# Patient Record
Sex: Female | Born: 1990 | Hispanic: No | Marital: Married | Smoking: Never smoker
Health system: Southern US, Community
[De-identification: ages and names within clinical notes are randomized; demographics above are authoritative.]

## PROBLEM LIST (undated history)

## (undated) DIAGNOSIS — Z789 Other specified health status: Secondary | ICD-10-CM

## (undated) DIAGNOSIS — L723 Sebaceous cyst: Secondary | ICD-10-CM

## (undated) HISTORY — PX: NO PAST SURGERIES: SHX2092

---

## 2017-04-03 ENCOUNTER — Encounter: Payer: Self-pay | Admitting: Obstetrics & Gynecology

## 2017-04-03 ENCOUNTER — Encounter: Payer: Self-pay | Admitting: *Deleted

## 2017-04-03 ENCOUNTER — Ambulatory Visit (INDEPENDENT_AMBULATORY_CARE_PROVIDER_SITE_OTHER): Payer: Self-pay | Admitting: *Deleted

## 2017-04-03 DIAGNOSIS — Z3200 Encounter for pregnancy test, result unknown: Secondary | ICD-10-CM

## 2017-04-03 DIAGNOSIS — Z3201 Encounter for pregnancy test, result positive: Secondary | ICD-10-CM

## 2017-04-03 LAB — POCT PREGNANCY, URINE: Preg Test, Ur: POSITIVE — AB

## 2017-04-03 NOTE — Progress Notes (Signed)
Here for pregnancy test which was positive. States LMP 02/24/17 and has regular periods. Which makes her 5 wk 3d. Letter given. She is not sure where she will receive prenatal care Not taking any meds. Encouraged to start prenatal vitamins but no other meds without checking with her prenatal provider.

## 2017-05-04 ENCOUNTER — Other Ambulatory Visit (HOSPITAL_COMMUNITY): Payer: Self-pay | Admitting: Nurse Practitioner

## 2017-05-04 DIAGNOSIS — Z3A13 13 weeks gestation of pregnancy: Secondary | ICD-10-CM

## 2017-05-04 DIAGNOSIS — Z3682 Encounter for antenatal screening for nuchal translucency: Secondary | ICD-10-CM

## 2017-05-04 LAB — OB RESULTS CONSOLE RPR: RPR: NONREACTIVE

## 2017-05-04 LAB — OB RESULTS CONSOLE RUBELLA ANTIBODY, IGM: Rubella: IMMUNE

## 2017-05-04 LAB — OB RESULTS CONSOLE HEPATITIS B SURFACE ANTIGEN: HEP B S AG: NEGATIVE

## 2017-05-04 LAB — OB RESULTS CONSOLE GC/CHLAMYDIA
CHLAMYDIA, DNA PROBE: NEGATIVE
Gonorrhea: NEGATIVE

## 2017-05-04 LAB — OB RESULTS CONSOLE ANTIBODY SCREEN: Antibody Screen: NEGATIVE

## 2017-05-04 LAB — OB RESULTS CONSOLE ABO/RH: RH Type: POSITIVE

## 2017-05-04 LAB — OB RESULTS CONSOLE HIV ANTIBODY (ROUTINE TESTING): HIV: NONREACTIVE

## 2017-05-26 ENCOUNTER — Ambulatory Visit (HOSPITAL_COMMUNITY): Payer: Self-pay

## 2017-05-27 ENCOUNTER — Encounter (HOSPITAL_COMMUNITY): Payer: Self-pay | Admitting: *Deleted

## 2017-05-29 ENCOUNTER — Encounter (HOSPITAL_COMMUNITY): Payer: Self-pay

## 2017-05-29 ENCOUNTER — Ambulatory Visit (HOSPITAL_COMMUNITY): Admission: RE | Admit: 2017-05-29 | Payer: Medicaid Other | Source: Ambulatory Visit

## 2017-05-29 ENCOUNTER — Ambulatory Visit (HOSPITAL_COMMUNITY)
Admission: RE | Admit: 2017-05-29 | Discharge: 2017-05-29 | Disposition: A | Discharge status: Home / Self Care | Payer: Medicaid Other | Source: Ambulatory Visit | Attending: Nurse Practitioner | Admitting: Nurse Practitioner

## 2017-05-29 DIAGNOSIS — Z3A13 13 weeks gestation of pregnancy: Secondary | ICD-10-CM | POA: Diagnosis not present

## 2017-05-29 DIAGNOSIS — Z3682 Encounter for antenatal screening for nuchal translucency: Secondary | ICD-10-CM | POA: Diagnosis not present

## 2017-05-29 HISTORY — DX: Other specified health status: Z78.9

## 2017-10-29 LAB — OB RESULTS CONSOLE GC/CHLAMYDIA
Chlamydia: NEGATIVE
GC PROBE AMP, GENITAL: NEGATIVE

## 2017-10-29 LAB — OB RESULTS CONSOLE GBS: STREP GROUP B AG: NEGATIVE

## 2017-12-04 ENCOUNTER — Telehealth (HOSPITAL_COMMUNITY): Payer: Self-pay | Admitting: *Deleted

## 2017-12-04 NOTE — Telephone Encounter (Signed)
Preadmission screen  [phone] interpreter number

## 2017-12-08 ENCOUNTER — Encounter (HOSPITAL_COMMUNITY): Payer: Self-pay

## 2017-12-08 ENCOUNTER — Inpatient Hospital Stay (HOSPITAL_COMMUNITY)
Admission: RE | Admit: 2017-12-08 | Discharge: 2017-12-13 | DRG: 788 | Disposition: A | Discharge status: Home / Self Care | Payer: Medicaid Other | Source: Ambulatory Visit | Attending: Obstetrics and Gynecology | Admitting: Obstetrics and Gynecology

## 2017-12-08 ENCOUNTER — Other Ambulatory Visit: Payer: Self-pay

## 2017-12-08 DIAGNOSIS — Z3A41 41 weeks gestation of pregnancy: Secondary | ICD-10-CM

## 2017-12-08 DIAGNOSIS — O480 Post-term pregnancy: Principal | ICD-10-CM | POA: Diagnosis present

## 2017-12-08 LAB — CBC
HCT: 38.6 % (ref 36.0–46.0)
Hemoglobin: 13.3 g/dL (ref 12.0–15.0)
MCH: 32.3 pg (ref 26.0–34.0)
MCHC: 34.5 g/dL (ref 30.0–36.0)
MCV: 93.7 fL (ref 78.0–100.0)
PLATELETS: 222 10*3/uL (ref 150–400)
RBC: 4.12 MIL/uL (ref 3.87–5.11)
RDW: 12.9 % (ref 11.5–15.5)
WBC: 5 10*3/uL (ref 4.0–10.5)

## 2017-12-08 LAB — ABO/RH: ABO/RH(D): O POS

## 2017-12-08 LAB — TYPE AND SCREEN
ABO/RH(D): O POS
Antibody Screen: NEGATIVE

## 2017-12-08 MED ORDER — LIDOCAINE HCL (PF) 1 % IJ SOLN: 30.0000 mL | INTRAMUSCULAR | Status: DC | PRN

## 2017-12-08 MED ORDER — MISOPROSTOL 50MCG HALF TABLET
50.0000 ug | ORAL_TABLET | ORAL | Status: DC | PRN
  Administered 2017-12-08: 50 ug via ORAL
  Filled 2017-12-08: qty 1

## 2017-12-08 MED ORDER — ONDANSETRON HCL 4 MG/2ML IJ SOLN: 4.0000 mg | Freq: Four times a day (QID) | INTRAMUSCULAR | Status: DC | PRN

## 2017-12-08 MED ORDER — OXYCODONE-ACETAMINOPHEN 5-325 MG PO TABS: 2.0000 | ORAL_TABLET | ORAL | Status: DC | PRN

## 2017-12-08 MED ORDER — OXYTOCIN 40 UNITS IN LACTATED RINGERS INFUSION - SIMPLE MED: 2.5000 [IU]/h | INTRAVENOUS | Status: DC

## 2017-12-08 MED ORDER — OXYTOCIN BOLUS FROM INFUSION: 500.0000 mL | Freq: Once | INTRAVENOUS | Status: DC

## 2017-12-08 MED ORDER — SOD CITRATE-CITRIC ACID 500-334 MG/5ML PO SOLN
30.0000 mL | ORAL | Status: DC | PRN
  Administered 2017-12-10: 30 mL via ORAL
  Filled 2017-12-08: qty 15

## 2017-12-08 MED ORDER — LACTATED RINGERS IV SOLN
500.0000 mL | INTRAVENOUS | Status: DC | PRN
  Administered 2017-12-08 – 2017-12-09 (×3): 500 mL via INTRAVENOUS

## 2017-12-08 MED ORDER — ACETAMINOPHEN 325 MG PO TABS: 650.0000 mg | ORAL_TABLET | ORAL | Status: DC | PRN

## 2017-12-08 MED ORDER — FENTANYL CITRATE (PF) 100 MCG/2ML IJ SOLN
100.0000 ug | INTRAMUSCULAR | Status: DC | PRN
Start: 2017-12-08 — End: 2017-12-10
  Administered 2017-12-09: 100 ug via INTRAVENOUS
  Filled 2017-12-08: qty 2

## 2017-12-08 MED ORDER — OXYTOCIN 40 UNITS IN LACTATED RINGERS INFUSION - SIMPLE MED
1.0000 m[IU]/min | INTRAVENOUS | Status: DC
  Administered 2017-12-08: 1 m[IU]/min via INTRAVENOUS

## 2017-12-08 MED ORDER — TERBUTALINE SULFATE 1 MG/ML IJ SOLN: 0.2500 mg | Freq: Once | INTRAMUSCULAR | Status: DC | PRN

## 2017-12-08 MED ORDER — MISOPROSTOL 25 MCG QUARTER TABLET: 25.0000 ug | ORAL_TABLET | ORAL | Status: DC | PRN

## 2017-12-08 MED ORDER — LACTATED RINGERS IV SOLN
INTRAVENOUS | Status: DC
  Administered 2017-12-08 – 2017-12-10 (×10): via INTRAVENOUS

## 2017-12-08 MED ORDER — OXYTOCIN 40 UNITS IN LACTATED RINGERS INFUSION - SIMPLE MED
1.0000 m[IU]/min | INTRAVENOUS | Status: DC
  Filled 2017-12-08: qty 1000

## 2017-12-08 MED ORDER — OXYCODONE-ACETAMINOPHEN 5-325 MG PO TABS: 1.0000 | ORAL_TABLET | ORAL | Status: DC | PRN

## 2017-12-08 NOTE — Progress Notes (Signed)
LABOR PROGRESS NOTE  Subjective:  Patient seen and examined for progress of labor. Patient comfortable and feels contractions.   Objective:  Vitals:   12/08/17 1931 12/08/17 2116 12/08/17 2219 12/08/17 2234  BP: (!) 108/59 100/61 106/85 94/72  Pulse: 80 83 79 96  Resp: 16 16 16 16   Temp: 98.1 F (36.7 C)     TempSrc: Oral     Weight:      Height:       Dilation: 4.5 Effacement (%): 50 Cervical Position: Posterior Station: -3 Presentation: Vertex Exam by:: Degele FHT: 150 bpm, moderate variability, accelerations present, early decelerations TOCO: regular, every 1-2 minutes  Assessment/Plan: 27 y.o. G2P0010 at 4092w0d here for IOL for postdates  Labor: titrating pit Preeclampsia: N/A Fetal wellbeing: category 1 Pain control: IV pain meds per pt request I/D: N/A Anticipated MOD: continue expectant management, anticipate NSVD  Durward Parcelavid McMullen, DO, PGY-2 12/08/2017, 10:59 PM

## 2017-12-08 NOTE — Progress Notes (Signed)
LABOR PROGRESS NOTE  Lindsey Hines is a 27 y.o. G2P0010 at 478w0d  admitted for IOL for postdates  Subjective: FB out  Objective: BP (!) 106/55 (BP Location: Left Arm)   Pulse 77   Temp 98.4 F (36.9 C) (Oral)   Resp 20   Ht 5\' 5"  (1.651 m)   Wt 195 lb 8 oz (88.7 kg)   LMP 02/24/2017 (Exact Date)   BMI 32.53 kg/m  or  Vitals:   12/08/17 0723 12/08/17 0926 12/08/17 1354 12/08/17 1611  BP: 122/80 104/78 114/72 (!) 106/55  Pulse: 89 92 89 77  Resp:  18 18 20   Temp:   99.2 F (37.3 C) 98.4 F (36.9 C)  TempSrc:   Oral Oral  Weight:      Height:        SVE Dilation: 4 Effacement (%): 60 Station: -2 Presentation: Vertex Exam by:: Marcelino DusterMichelle, RN  FHT: baseline rate 145, moderate varibility, + acel, has had no decels for a while, but just had one 3 minute decel; now FHT reactive again  Toco: ctx q 1-3 min  Assessment / Plan: 27 y.o. G2P0010 at 378w0d here for IOL for postdates  Labor: FB out. Expectant management for now; ctx regularly; start Pit if no progressing Fetal Wellbeing:  Cat II Pain Control:  Per patient's request Anticipated MOD:  SVD  Frederik PearJulie P Tecia Cinnamon, MD 12/08/2017, 7:21 PM

## 2017-12-08 NOTE — H&P (Signed)
Lindsey Hines is a 27 y.o. female presenting for Induction of labor for postdates Received care at [facility]. OB History    Gravida  2   Para      Term      Preterm      AB  1   Living        SAB  1   TAB      Ectopic      Multiple      Live Births             Past Medical History:  Diagnosis Date  . Medical history non-contributory    Past Surgical History:  Procedure Laterality Date  . NO PAST SURGERIES     Family History: family history is not on file. Social History:  reports that she has never smoked. She has never used smokeless tobacco. She reports that she does not drink alcohol or use drugs.     Maternal Diabetes: No Genetic Screening: Normal Maternal Ultrasounds/Referrals: Normal Fetal Ultrasounds or other Referrals:  None Maternal Substance Abuse:  No Significant Maternal Medications:  None Significant Maternal Lab Results:  None Other Comments:  None  Review of Systems  Constitutional: Negative for chills, fever and malaise/fatigue.  Respiratory: Negative for shortness of breath.   Gastrointestinal: Negative for abdominal pain, constipation, diarrhea, nausea and vomiting.   Maternal Medical History:  Reason for admission: Nausea. IOL for postdates   Contractions: Frequency: irregular.   Perceived severity is mild.    Fetal activity: Perceived fetal activity is normal.   Last perceived fetal movement was within the past hour.    Prenatal complications: No PIH, placental abnormality, pre-eclampsia or preterm labor.   Prenatal Complications - Diabetes: none.    Dilation: Fingertip Effacement (%): 60 Station: -1, -2 Exam by:: Hilda LiasMarie, CNM  Blood pressure 122/80, pulse 89, temperature 98.6 F (37 C), temperature source Oral, resp. rate 18, height 5\' 5"  (1.651 m), weight 195 lb 8 oz (88.7 kg), last menstrual period 02/24/2017. Maternal Exam:  Uterine Assessment: Contraction strength is mild.  Contraction frequency is irregular.    Abdomen: Patient reports no abdominal tenderness. Estimated fetal weight is 6.5.   Fetal presentation: vertex  Introitus: Normal vulva. Normal vagina.  Ferning test: not done.  Nitrazine test: not done. Amniotic fluid character: not assessed.  Cervix: Cervix evaluated by digital exam.     Fetal Exam Fetal Monitor Review: Mode: ultrasound.   Baseline rate: 140.  Variability: moderate (6-25 bpm).   Pattern: accelerations present and no decelerations.    Fetal State Assessment: Category I - tracings are normal.     Physical Exam  Constitutional: She is oriented to person, place, and time. She appears well-developed and well-nourished.  HENT:  Head: Normocephalic.  Cardiovascular: Normal rate and regular rhythm.  Respiratory: Effort normal. No respiratory distress. She has no wheezes. She has no rales.  GI: Soft. She exhibits no distension. There is no tenderness. There is no rebound and no guarding.  Genitourinary:  Genitourinary Comments: Dilation: Fingertip Effacement (%): 60 Station: -1, -2 Presentation: Vertex Exam by:: Hilda LiasMarie, CNM    Musculoskeletal: Normal range of motion.  Neurological: She is alert and oriented to person, place, and time.  Skin: Skin is warm and dry.  Psychiatric: She has a normal mood and affect.    Prenatal labs: ABO, Rh: O/Positive/-- (08/27 0000) Antibody: Negative (08/27 0000) Rubella: Immune (08/27 0000) RPR: Nonreactive (08/27 0000)  HBsAg: Negative (08/27 0000)  HIV: Non-reactive (08/27  0000)  GBS: Negative (02/21 0000)   Assessment/Plan: Single IUP at [redacted]w[redacted]d Postterm  Admit to [facility] suites Routine orders Cytotec PO then Foley then Lyndle Herrlich 12/08/2017, 7:57 AM

## 2017-12-08 NOTE — Anesthesia Pain Management Evaluation Note (Signed)
  CRNA Pain Management Visit Note  Patient: Lindsey Hines, 27 y.o., female  "Hello I am a member of the anesthesia team at [facility]. We have an anesthesia team available at all times to provide care throughout the hospital, including epidural management and anesthesia for C-section. I don't know your plan for the delivery whether it a natural birth, water birth, IV sedation, nitrous supplementation, doula or epidural, but we want to meet your pain goals."   1.Was your pain managed to your expectations on prior hospitalizations?   No prior hospitalizations  2.What is your expectation for pain management during this hospitalization?     Labor support without medications, Epidural, IV pain meds and Nitrous Oxide  3.How can we help you reach that goal? All methods of pain management discussed and questions answered.  Record the patient's initial score and the patient's pain goal.   Pain: 0  Pain Goal: 7 [facility] wants you to be able to say your pain was always managed very well.  Lindsey Hines 12/08/2017

## 2017-12-08 NOTE — Progress Notes (Signed)
LABOR PROGRESS NOTE  Lindsey Hines is a 27 y.o. G2P0010 at 751w0d  admitted for IOL for postdates  Subjective: Pt doing well; feeling some contractions  Objective: BP 114/72   Pulse 89   Temp 99.2 F (37.3 C) (Oral)   Resp 18   Ht 5\' 5"  (1.651 m)   Wt 195 lb 8 oz (88.7 kg)   LMP 02/24/2017 (Exact Date)   BMI 32.53 kg/m  or  Vitals:   12/08/17 0722 12/08/17 0723 12/08/17 0926 12/08/17 1354  BP:  122/80 104/78 114/72  Pulse:  89 92 89  Resp: 18  18 18   Temp: 98.6 F (37 C)   99.2 F (37.3 C)  TempSrc: Oral   Oral  Weight:      Height:        SVE Dilation: 1 Effacement (%): 60 Station: -3 Presentation: Vertex Exam by:: dr Murlean Seelye Amada KingfisherFHT: baseline rate 145, moderate varibility, + acel, occasinal prolonged decels (~4 min, with good spontaneous recovery) Toco: ctx q 1-4 min  Assessment / Plan: 27 y.o. G2P0010 at 581w0d here for IOL for postdates  Labor: s/p cytotec x 1. FB placed at 12:45. Will hold off on additional cytotec d/t decels. Pt is contracting regularly. Likely start Pitocin next Fetal Wellbeing:  Cat II Pain Control:  Per patient's request Anticipated MOD:  SVD  Frederik PearJulie P Cono Gebhard, MD 12/08/2017, 2:21 PM

## 2017-12-09 ENCOUNTER — Inpatient Hospital Stay (HOSPITAL_COMMUNITY): Payer: Medicaid Other | Admitting: Anesthesiology

## 2017-12-09 LAB — RPR: RPR: NONREACTIVE

## 2017-12-09 MED ORDER — DIPHENHYDRAMINE HCL 50 MG/ML IJ SOLN: 12.5000 mg | INTRAMUSCULAR | Status: DC | PRN

## 2017-12-09 MED ORDER — OXYTOCIN 40 UNITS IN LACTATED RINGERS INFUSION - SIMPLE MED
1.0000 m[IU]/min | INTRAVENOUS | Status: DC
  Administered 2017-12-10: 2 m[IU]/min via INTRAVENOUS

## 2017-12-09 MED ORDER — LACTATED RINGERS IV SOLN: 500.0000 mL | Freq: Once | INTRAVENOUS | Status: DC

## 2017-12-09 MED ORDER — EPHEDRINE 5 MG/ML INJ: 10.0000 mg | INTRAVENOUS | Status: DC | PRN

## 2017-12-09 MED ORDER — PHENYLEPHRINE 40 MCG/ML (10ML) SYRINGE FOR IV PUSH (FOR BLOOD PRESSURE SUPPORT)
80.0000 ug | PREFILLED_SYRINGE | INTRAVENOUS | Status: DC | PRN
  Filled 2017-12-09: qty 10

## 2017-12-09 MED ORDER — LIDOCAINE HCL (PF) 1 % IJ SOLN
INTRAMUSCULAR | Status: DC | PRN
  Administered 2017-12-09 (×2): 5 mL

## 2017-12-09 MED ORDER — PHENYLEPHRINE 40 MCG/ML (10ML) SYRINGE FOR IV PUSH (FOR BLOOD PRESSURE SUPPORT)
80.0000 ug | PREFILLED_SYRINGE | INTRAVENOUS | Status: DC | PRN
  Administered 2017-12-09 (×2): 80 ug via INTRAVENOUS
  Filled 2017-12-09: qty 10

## 2017-12-09 MED ORDER — FENTANYL 2.5 MCG/ML BUPIVACAINE 1/10 % EPIDURAL INFUSION (WH - ANES)
14.0000 mL/h | INTRAMUSCULAR | Status: DC | PRN
  Administered 2017-12-09: 14 mL/h via EPIDURAL
  Filled 2017-12-09: qty 100

## 2017-12-09 NOTE — Progress Notes (Signed)
Patient ID: Lindsey Hines, female   DOB: 04/22/1991, 27 y.o.   MRN: [id number] Doing well, but hurting with contractions Wants epidural  Vitals:   12/09/17 1721 12/09/17 1735 12/09/17 1808 12/09/17 2038  BP:  116/76 115/70 (!) 99/46  Pulse:  80 95 84  Resp: 16 18 18 16   Temp:    97.9 F (36.6 C)  TempSrc:    Oral  Weight:      Height:       FHR stable UCs every 3 min  Dilation: 4 Effacement (%): 70 Cervical Position: Posterior Station: -2, Ballotable Presentation: Vertex Exam by:: Wynelle BourgeoisMarie Williams, CNM  Discussed cervix has not changed and I believe it is 4cm and not 5cm Recommend one hour Pitocin break and then possible AROM and IUPC  WIll reassess after epidural

## 2017-12-09 NOTE — Progress Notes (Signed)
Patient ID: Lauretta GrillDalal Yousif Hatton, female   DOB: 11/11/1990, 27 y.o.   MRN: [id number] Called to see pt forfetal bradycardic episode following post-epidural hypotensive episode BPs noted to be hypotensive Ephedrine given x 2 Fluid bolusing  FHR recovered to baseline after 8 minutes  Recommend 500ml bolus Monitor VS Continue to observe

## 2017-12-09 NOTE — Progress Notes (Signed)
Labor Progress Note Lindsey Hines is a 27 y.o. G2P0010 at 6032w1d presented for IOL for postdates  S:  Patient comfortable  O:  BP 109/79   Pulse 62   Temp 98.3 F (36.8 C) (Oral)   Resp 16   Ht 5\' 5"  (1.651 m)   Wt 195 lb 8 oz (88.7 kg)   LMP 02/24/2017 (Exact Date)   BMI 32.53 kg/m   Fetal Tracing:  Baseline: 135 Variability: moderate Accels: 15x15 Decels: none  Toco: 2-4  CVE: Dilation: 5 Effacement (%): 60 Cervical Position: Posterior Station: -3 Presentation: Vertex Exam by:: Rayfield Citizenaroline, CNM   A&P: 27 y.o. G2P0010 5632w1d postdates IOL #Labor: Cervix more anterior but unchanged in dilation. Will continue pitocin 2x2. #Pain: per patient request #FWB: Cat 1 #GBS negative  Rolm Bookbinderaroline M Charda Janis, CNM 1:27 PM

## 2017-12-09 NOTE — Anesthesia Procedure Notes (Signed)
Epidural Patient location during procedure: OB  Staffing Anesthesiologist: Melody Savidge, MD Performed: anesthesiologist   Preanesthetic Checklist Completed: patient identified, site marked, surgical consent, pre-op evaluation, timeout performed, IV checked, risks and benefits discussed and monitors and equipment checked  Epidural Patient position: sitting Prep: DuraPrep Patient monitoring: heart rate, continuous pulse ox and blood pressure Approach: right paramedian Location: L3-L4 Injection technique: LOR saline  Needle:  Needle type: Tuohy  Needle gauge: 17 G Needle length: 9 cm and 9 Needle insertion depth: 7 cm Catheter type: closed end flexible Catheter size: 20 Guage Catheter at skin depth: 11 cm Test dose: negative  Assessment Events: blood not aspirated, injection not painful, no injection resistance, negative IV test and no paresthesia  Additional Notes Patient identified. Risks/Benefits/Options discussed with patient including but not limited to bleeding, infection, nerve damage, paralysis, failed block, incomplete pain control, headache, blood pressure changes, nausea, vomiting, reactions to medication both or allergic, itching and postpartum back pain. Confirmed with bedside nurse the patient's most recent platelet count. Confirmed with patient that they are not currently taking any anticoagulation, have any bleeding history or any family history of bleeding disorders. Patient expressed understanding and wished to proceed. All questions were answered. Sterile technique was used throughout the entire procedure. Please see nursing notes for vital signs. Test dose was given through epidural needle and negative prior to continuing to dose epidural or start infusion. Warning signs of high block given to the patient including shortness of breath, tingling/numbness in hands, complete motor block, or any concerning symptoms with instructions to call for help. Patient was given  instructions on fall risk and not to get out of bed. All questions and concerns addressed with instructions to call with any issues.     

## 2017-12-09 NOTE — Progress Notes (Signed)
LABOR PROGRESS NOTE  Subjective:  Patient seen and examined for progress of labor. Patient comfortable without need for pain meds and endorses pressure sensation which has been more frequent and intense.   Objective:  Vitals:   12/09/17 0031 12/09/17 0101 12/09/17 0131 12/09/17 0201  BP: 110/83 (!) 95/52 (!) 92/57 93/60  Pulse: 88 80 66 69  Resp: 16 16 16 16   Temp:      TempSrc:      Weight:      Height:       Dilation: 5 Effacement (%): 50 Cervical Position: Posterior Station: -2 Presentation: Vertex Exam by:: Marius Ditchaniell Wade, RN FHT: 150 bpm, moderate variability, accelerations present, absent decelerations TOCO: irregular, every 3-5 minutes  Assessment/Plan: 27 y.o.G2P0010 at 1039w0d here for IOL for postdates. Will continue titrating pit. Patient hopes to avoid epidural and seems to be tolerating pain without need for meds.  Labor: titrating pit Preeclampsia: N/A Fetal wellbeing: category 1 Pain control: IV pain meds per pt request I/D: neg Anticipated MOD: continue expectant management, anticipate NSVD  Durward Parcelavid McMullen, DO, PGY-2 12/09/2017, 2:27 AM

## 2017-12-09 NOTE — Progress Notes (Signed)
Labor Progress Note Mattilyn Druscilla BrownieYousif Depaz is a 27 y.o. G2P0010 at 1077w1d presented for IOL for postdates.  S:  Patient comfortable  O:  BP 94/67 (BP Location: Left Arm)   Pulse 93   Temp 97.9 F (36.6 C) (Oral)   Resp 16   Ht 5\' 5"  (1.651 m)   Wt 195 lb 8 oz (88.7 kg)   LMP 02/24/2017 (Exact Date)   BMI 32.53 kg/m   Fetal Tracing:  Baseline: 130 Variability: moderate Accels: 15x15 Decels: none  Toco: 2-4  CVE: Dilation: 5 Effacement (%): 60 Cervical Position: Posterior Station: -3 Presentation: Vertex Exam by:: Rayfield Citizenaroline, CNM   A&P: 27 y.o. G2P0010 607w1d IOL for postdates #Labor: No cervical change and fetal station still high in pelvis. Will change pitocin to 2x2 and recheck in 2 hours.  #Pain: plans IV pain medication #FWB: Cat 1 #GBS negative  Rolm Bookbinderaroline M Neill, CNM 9:10 AM

## 2017-12-09 NOTE — Progress Notes (Signed)
Labor Progress Note Lindsey Hines is a 27 y.o. G2P0010 at 5147w1d presented for postdates IOL  S:  Patient beginning to feel uncomfortable with contractions  O:  BP 115/70   Pulse 95   Temp (!) 97.5 F (36.4 C) (Axillary)   Resp 18   Ht 5\' 5"  (1.651 m)   Wt 195 lb 8 oz (88.7 kg)   LMP 02/24/2017 (Exact Date)   BMI 32.53 kg/m   Fetal Tracing:  Baseline: 145 Variability: moderate Accels: 15x15 Decels: none  Toco: 2-4   CVE: Dilation: 5.5 Effacement (%): 70 Cervical Position: Posterior Station: -3 Presentation: Vertex Exam by:: Rayfield Citizenaroline, CNM   A&P: 27 y.o. G2P0010 5547w1d postdates IOL #Labor: Some cervical change made, now with bloody show and patient discomfort. Will continue pitocin.  #Pain: per patient request #FWB: Cat 1 #GBS negative  Rolm Bookbinderaroline M Neill, CNM 05:00PM

## 2017-12-09 NOTE — Anesthesia Preprocedure Evaluation (Signed)

## 2017-12-10 ENCOUNTER — Encounter (HOSPITAL_COMMUNITY)
Admission: RE | Disposition: A | Discharge status: Home / Self Care | Payer: Self-pay | Source: Ambulatory Visit | Attending: Obstetrics and Gynecology

## 2017-12-10 ENCOUNTER — Encounter (HOSPITAL_COMMUNITY): Payer: Self-pay | Admitting: Obstetrics and Gynecology

## 2017-12-10 DIAGNOSIS — Z3A41 41 weeks gestation of pregnancy: Secondary | ICD-10-CM

## 2017-12-10 DIAGNOSIS — O480 Post-term pregnancy: Secondary | ICD-10-CM

## 2017-12-10 LAB — BIRTH TISSUE RECOVERY COLLECTION (PLACENTA DONATION)

## 2017-12-10 LAB — CREATININE, SERUM: CREATININE: 0.52 mg/dL (ref 0.44–1.00)

## 2017-12-10 SURGERY — Surgical Case
Anesthesia: Epidural

## 2017-12-10 MED ORDER — SODIUM CHLORIDE 0.9 % IR SOLN
Status: DC | PRN
  Administered 2017-12-10: 1

## 2017-12-10 MED ORDER — ONDANSETRON HCL 4 MG/2ML IJ SOLN
INTRAMUSCULAR | Status: DC | PRN
  Administered 2017-12-10: 4 mg via INTRAVENOUS

## 2017-12-10 MED ORDER — LIDOCAINE-EPINEPHRINE (PF) 2 %-1:200000 IJ SOLN
INTRAMUSCULAR | Status: AC
  Filled 2017-12-10: qty 20

## 2017-12-10 MED ORDER — WITCH HAZEL-GLYCERIN EX PADS: 1.0000 "application " | MEDICATED_PAD | CUTANEOUS | Status: DC | PRN

## 2017-12-10 MED ORDER — KETOROLAC TROMETHAMINE 30 MG/ML IJ SOLN: 30.0000 mg | Freq: Four times a day (QID) | INTRAMUSCULAR | Status: AC | PRN

## 2017-12-10 MED ORDER — FENTANYL CITRATE (PF) 100 MCG/2ML IJ SOLN: 25.0000 ug | INTRAMUSCULAR | Status: DC | PRN

## 2017-12-10 MED ORDER — NALOXONE HCL 0.4 MG/ML IJ SOLN: 0.4000 mg | INTRAMUSCULAR | Status: DC | PRN

## 2017-12-10 MED ORDER — GENTAMICIN SULFATE 40 MG/ML IJ SOLN
180.0000 mg | Freq: Once | INTRAVENOUS | Status: DC
  Filled 2017-12-10: qty 4.5

## 2017-12-10 MED ORDER — DEXAMETHASONE SODIUM PHOSPHATE 10 MG/ML IJ SOLN
INTRAMUSCULAR | Status: DC | PRN
  Administered 2017-12-10: 10 mg via INTRAVENOUS

## 2017-12-10 MED ORDER — NALBUPHINE HCL 10 MG/ML IJ SOLN: 5.0000 mg | Freq: Once | INTRAMUSCULAR | Status: DC | PRN

## 2017-12-10 MED ORDER — SIMETHICONE 80 MG PO CHEW: 80.0000 mg | CHEWABLE_TABLET | ORAL | Status: DC | PRN

## 2017-12-10 MED ORDER — OXYTOCIN 40 UNITS IN LACTATED RINGERS INFUSION - SIMPLE MED
2.5000 [IU]/h | INTRAVENOUS | Status: AC
  Administered 2017-12-10: 2.5 [IU]/h via INTRAVENOUS

## 2017-12-10 MED ORDER — SENNOSIDES-DOCUSATE SODIUM 8.6-50 MG PO TABS
2.0000 | ORAL_TABLET | ORAL | Status: DC
  Administered 2017-12-11 – 2017-12-12 (×3): 2 via ORAL
  Filled 2017-12-10 (×3): qty 2

## 2017-12-10 MED ORDER — DIBUCAINE 1 % RE OINT: 1.0000 "application " | TOPICAL_OINTMENT | RECTAL | Status: DC | PRN

## 2017-12-10 MED ORDER — IBUPROFEN 600 MG PO TABS
600.0000 mg | ORAL_TABLET | Freq: Four times a day (QID) | ORAL | Status: DC
  Administered 2017-12-10 – 2017-12-13 (×14): 600 mg via ORAL
  Filled 2017-12-10 (×15): qty 1

## 2017-12-10 MED ORDER — ACETAMINOPHEN 325 MG PO TABS
650.0000 mg | ORAL_TABLET | ORAL | Status: DC | PRN
  Administered 2017-12-11 – 2017-12-13 (×5): 650 mg via ORAL
  Filled 2017-12-10 (×5): qty 2

## 2017-12-10 MED ORDER — PRENATAL MULTIVITAMIN CH
1.0000 | ORAL_TABLET | Freq: Every day | ORAL | Status: DC
  Administered 2017-12-10 – 2017-12-13 (×4): 1 via ORAL
  Filled 2017-12-10 (×4): qty 1

## 2017-12-10 MED ORDER — SODIUM CHLORIDE 0.9% FLUSH: 3.0000 mL | INTRAVENOUS | Status: DC | PRN

## 2017-12-10 MED ORDER — COCONUT OIL OIL
1.0000 "application " | TOPICAL_OIL | Status: DC | PRN
  Administered 2017-12-12: 1 via TOPICAL
  Filled 2017-12-10: qty 120

## 2017-12-10 MED ORDER — SODIUM CHLORIDE 0.9 % IV SOLN
2.0000 g | Freq: Four times a day (QID) | INTRAVENOUS | Status: DC
  Administered 2017-12-10: 2 g via INTRAVENOUS
  Filled 2017-12-10: qty 2
  Filled 2017-12-10 (×2): qty 2000

## 2017-12-10 MED ORDER — NALBUPHINE HCL 10 MG/ML IJ SOLN: 5.0000 mg | INTRAMUSCULAR | Status: DC | PRN

## 2017-12-10 MED ORDER — SCOPOLAMINE 1 MG/3DAYS TD PT72: 1.0000 | MEDICATED_PATCH | Freq: Once | TRANSDERMAL | Status: DC

## 2017-12-10 MED ORDER — ZOLPIDEM TARTRATE 5 MG PO TABS: 5.0000 mg | ORAL_TABLET | Freq: Every evening | ORAL | Status: DC | PRN

## 2017-12-10 MED ORDER — LACTATED RINGERS IV SOLN
INTRAVENOUS | Status: DC
  Administered 2017-12-10: 18:00:00 via INTRAVENOUS

## 2017-12-10 MED ORDER — ONDANSETRON HCL 4 MG/2ML IJ SOLN
INTRAMUSCULAR | Status: AC
  Filled 2017-12-10: qty 2

## 2017-12-10 MED ORDER — SCOPOLAMINE 1 MG/3DAYS TD PT72
MEDICATED_PATCH | TRANSDERMAL | Status: DC | PRN
  Administered 2017-12-10: 1 via TRANSDERMAL

## 2017-12-10 MED ORDER — SIMETHICONE 80 MG PO CHEW
80.0000 mg | CHEWABLE_TABLET | Freq: Three times a day (TID) | ORAL | Status: DC
  Administered 2017-12-10 – 2017-12-13 (×12): 80 mg via ORAL
  Filled 2017-12-10 (×12): qty 1

## 2017-12-10 MED ORDER — MEPERIDINE HCL 25 MG/ML IJ SOLN: 6.2500 mg | INTRAMUSCULAR | Status: DC | PRN

## 2017-12-10 MED ORDER — CEFAZOLIN SODIUM-DEXTROSE 2-3 GM-%(50ML) IV SOLR
INTRAVENOUS | Status: DC | PRN
  Administered 2017-12-10: 2 g via INTRAVENOUS

## 2017-12-10 MED ORDER — MORPHINE SULFATE (PF) 0.5 MG/ML IJ SOLN
INTRAMUSCULAR | Status: AC
  Filled 2017-12-10: qty 10

## 2017-12-10 MED ORDER — DIPHENHYDRAMINE HCL 50 MG/ML IJ SOLN
12.5000 mg | INTRAMUSCULAR | Status: DC | PRN
  Administered 2017-12-10: 12.5 mg via INTRAVENOUS
  Filled 2017-12-10: qty 1

## 2017-12-10 MED ORDER — PHENYLEPHRINE 40 MCG/ML (10ML) SYRINGE FOR IV PUSH (FOR BLOOD PRESSURE SUPPORT)
PREFILLED_SYRINGE | INTRAVENOUS | Status: AC
  Filled 2017-12-10: qty 10

## 2017-12-10 MED ORDER — TETANUS-DIPHTH-ACELL PERTUSSIS 5-2.5-18.5 LF-MCG/0.5 IM SUSP: 0.5000 mL | Freq: Once | INTRAMUSCULAR | Status: DC

## 2017-12-10 MED ORDER — SIMETHICONE 80 MG PO CHEW
80.0000 mg | CHEWABLE_TABLET | ORAL | Status: DC
  Administered 2017-12-11 – 2017-12-12 (×3): 80 mg via ORAL
  Filled 2017-12-10 (×3): qty 1

## 2017-12-10 MED ORDER — MORPHINE SULFATE (PF) 0.5 MG/ML IJ SOLN
INTRAMUSCULAR | Status: DC | PRN
  Administered 2017-12-10: 3 mg via EPIDURAL

## 2017-12-10 MED ORDER — SCOPOLAMINE 1 MG/3DAYS TD PT72
MEDICATED_PATCH | TRANSDERMAL | Status: AC
  Filled 2017-12-10: qty 1

## 2017-12-10 MED ORDER — LACTATED RINGERS IV SOLN
INTRAVENOUS | Status: DC | PRN
  Administered 2017-12-10: 40 [IU] via INTRAVENOUS

## 2017-12-10 MED ORDER — DEXAMETHASONE SODIUM PHOSPHATE 10 MG/ML IJ SOLN
INTRAMUSCULAR | Status: AC
  Filled 2017-12-10: qty 1

## 2017-12-10 MED ORDER — ONDANSETRON HCL 4 MG/2ML IJ SOLN: 4.0000 mg | Freq: Three times a day (TID) | INTRAMUSCULAR | Status: DC | PRN

## 2017-12-10 MED ORDER — MENTHOL 3 MG MT LOZG: 1.0000 | LOZENGE | OROMUCOSAL | Status: DC | PRN

## 2017-12-10 MED ORDER — SODIUM BICARBONATE 8.4 % IV SOLN
INTRAVENOUS | Status: DC | PRN
  Administered 2017-12-10: 10 mL via EPIDURAL

## 2017-12-10 MED ORDER — NALOXONE HCL 4 MG/10ML IJ SOLN
1.0000 ug/kg/h | INTRAVENOUS | Status: DC | PRN
  Filled 2017-12-10: qty 5

## 2017-12-10 MED ORDER — GENTAMICIN SULFATE 40 MG/ML IJ SOLN
170.0000 mg | Freq: Three times a day (TID) | INTRAMUSCULAR | Status: DC
  Filled 2017-12-10: qty 4.25

## 2017-12-10 MED ORDER — CEFAZOLIN SODIUM-DEXTROSE 2-4 GM/100ML-% IV SOLN
INTRAVENOUS | Status: AC
  Filled 2017-12-10: qty 100

## 2017-12-10 MED ORDER — SODIUM BICARBONATE 8.4 % IV SOLN
INTRAVENOUS | Status: AC
  Filled 2017-12-10: qty 50

## 2017-12-10 MED ORDER — OXYTOCIN 10 UNIT/ML IJ SOLN
INTRAMUSCULAR | Status: AC
  Filled 2017-12-10: qty 4

## 2017-12-10 MED ORDER — ENOXAPARIN SODIUM 40 MG/0.4ML ~~LOC~~ SOLN
40.0000 mg | SUBCUTANEOUS | Status: DC
  Administered 2017-12-11 – 2017-12-12 (×2): 40 mg via SUBCUTANEOUS
  Filled 2017-12-10 (×3): qty 0.4

## 2017-12-10 MED ORDER — GENTAMICIN SULFATE 40 MG/ML IJ SOLN
170.0000 mg | Freq: Three times a day (TID) | INTRAVENOUS | Status: AC
  Administered 2017-12-10 (×2): 170 mg via INTRAVENOUS
  Filled 2017-12-10 (×2): qty 4.25

## 2017-12-10 MED ORDER — LACTATED RINGERS IV SOLN
INTRAVENOUS | Status: DC | PRN
  Administered 2017-12-10: 04:00:00 via INTRAVENOUS

## 2017-12-10 MED ORDER — DIPHENHYDRAMINE HCL 25 MG PO CAPS
25.0000 mg | ORAL_CAPSULE | ORAL | Status: DC | PRN
Start: 2017-12-10 — End: 2017-12-13
  Administered 2017-12-10 – 2017-12-11 (×2): 25 mg via ORAL
  Filled 2017-12-10 (×3): qty 1

## 2017-12-10 MED ORDER — DIPHENHYDRAMINE HCL 25 MG PO CAPS: 25.0000 mg | ORAL_CAPSULE | Freq: Four times a day (QID) | ORAL | Status: DC | PRN

## 2017-12-10 MED ORDER — METOCLOPRAMIDE HCL 5 MG/ML IJ SOLN: 10.0000 mg | Freq: Once | INTRAMUSCULAR | Status: DC | PRN

## 2017-12-10 MED ORDER — ACETAMINOPHEN 500 MG PO TABS
1000.0000 mg | ORAL_TABLET | Freq: Four times a day (QID) | ORAL | Status: AC
  Administered 2017-12-10 – 2017-12-11 (×3): 1000 mg via ORAL
  Filled 2017-12-10 (×3): qty 2

## 2017-12-10 MED ORDER — LACTATED RINGERS IV SOLN: INTRAVENOUS | Status: DC

## 2017-12-10 MED ORDER — PHENYLEPHRINE HCL 10 MG/ML IJ SOLN
INTRAMUSCULAR | Status: DC | PRN
  Administered 2017-12-10: 80 ug via INTRAVENOUS

## 2017-12-10 SURGICAL SUPPLY — 36 items
BENZOIN TINCTURE PRP APPL 2/3 (GAUZE/BANDAGES/DRESSINGS) ×2 IMPLANT
CHLORAPREP W/TINT 26ML (MISCELLANEOUS) ×2 IMPLANT
CLAMP CORD UMBIL (MISCELLANEOUS) IMPLANT
CLOSURE STERI STRIP 1/2 X4 (GAUZE/BANDAGES/DRESSINGS) ×2 IMPLANT
CLOTH BEACON ORANGE TIMEOUT ST (SAFETY) ×2 IMPLANT
DRSG OPSITE POSTOP 4X10 (GAUZE/BANDAGES/DRESSINGS) ×2 IMPLANT
ELECT REM PT RETURN 9FT ADLT (ELECTROSURGICAL) ×2
ELECTRODE REM PT RTRN 9FT ADLT (ELECTROSURGICAL) ×1 IMPLANT
EXTRACTOR VACUUM KIWI (MISCELLANEOUS) IMPLANT
GLOVE BIO SURGEON ST LM GN SZ9 (GLOVE) ×2 IMPLANT
GLOVE BIOGEL PI IND STRL 7.0 (GLOVE) ×1 IMPLANT
GLOVE BIOGEL PI IND STRL 9 (GLOVE) ×1 IMPLANT
GLOVE BIOGEL PI INDICATOR 7.0 (GLOVE) ×1
GLOVE BIOGEL PI INDICATOR 9 (GLOVE) ×1
GOWN STRL REUS W/TWL 2XL LVL3 (GOWN DISPOSABLE) ×2 IMPLANT
GOWN STRL REUS W/TWL LRG LVL3 (GOWN DISPOSABLE) ×2 IMPLANT
NEEDLE HYPO 25X5/8 SAFETYGLIDE (NEEDLE) IMPLANT
NS IRRIG 1000ML POUR BTL (IV SOLUTION) ×2 IMPLANT
PACK C SECTION WH (CUSTOM PROCEDURE TRAY) ×2 IMPLANT
PAD OB MATERNITY 4.3X12.25 (PERSONAL CARE ITEMS) ×2 IMPLANT
PENCIL SMOKE EVAC W/HOLSTER (ELECTROSURGICAL) ×2 IMPLANT
RTRCTR C-SECT PINK 25CM LRG (MISCELLANEOUS) IMPLANT
RTRCTR C-SECT PINK 34CM XLRG (MISCELLANEOUS) IMPLANT
STRIP CLOSURE SKIN 1/2X4 (GAUZE/BANDAGES/DRESSINGS) ×2 IMPLANT
SUT MNCRL 0 VIOLET CTX 36 (SUTURE) ×2 IMPLANT
SUT MONOCRYL 0 CTX 36 (SUTURE) ×2
SUT PLAIN 2 0 (SUTURE) ×1
SUT PLAIN ABS 2-0 CT1 27XMFL (SUTURE) ×1 IMPLANT
SUT VIC AB 0 CT1 27 (SUTURE) ×1
SUT VIC AB 0 CT1 27XBRD ANBCTR (SUTURE) ×1 IMPLANT
SUT VIC AB 2-0 CT1 27 (SUTURE) ×1
SUT VIC AB 2-0 CT1 TAPERPNT 27 (SUTURE) ×1 IMPLANT
SUT VIC AB 4-0 KS 27 (SUTURE) ×2 IMPLANT
SYR BULB IRRIGATION 50ML (SYRINGE) IMPLANT
TOWEL OR 17X24 6PK STRL BLUE (TOWEL DISPOSABLE) ×2 IMPLANT
TRAY FOLEY BAG SILVER LF 14FR (SET/KITS/TRAYS/PACK) ×2 IMPLANT

## 2017-12-10 NOTE — Progress Notes (Signed)
Patient ID: Lindsey Hines, female   DOB: 05/10/1991, 27 y.o.   MRN: [id number] Called to room for FHR decel  Decel lasted 2-3 min, then recovered to baseline  Good variability but few accels  UCs are not strong Pitocin infusing at low dose  Will start amnioinfusion

## 2017-12-10 NOTE — Op Note (Addendum)
12/08/2017 - 12/10/2017  5:27 AM  PATIENT:  Lindsey Hines  27 y.o. female  PRE-OPERATIVE DIAGNOSIS:  primary cesarean section due to fetal heart rate indication, fetal intolerance of labor  POST-OPERATIVE DIAGNOSIS:  primary cesarean section due to fetal heart rate indication, fetal intolerance of labor, delivered  PROCEDURE:  Procedure(s): CESAREAN SECTION (N/A) primary low transverse   SURGEON:  Surgeon(s) and Role:    * Tilda BurrowFerguson, Kaire Stary V, MD - Primary  PHYSICIAN ASSISTANT: Sharyon CableAaron Wooten, PA-S  ASSISTANTS: none   ANESTHESIA:   epidural  EBL:  600 mL   BLOOD ADMINISTERED:none  DRAINS: Urinary Catheter (Foley)   LOCAL MEDICATIONS USED:  NONE  SPECIMEN:  Source of Specimen:   Placenta to labor and delivery  DISPOSITION OF SPECIMEN:  N/A  COUNTS:  YES  TOURNIQUET:  * No tourniquets in log *  DICTATION: .Dragon Dictation  PLAN OF CARE: Has inpatient admission orders already  PATIENT DISPOSITION:  PACU - hemodynamically stable.   Delay start of Pharmacological VTE agent (>24hrs) due to surgical blood loss or risk of bleeding: not applicable Indication: Recurrent and frequent prolonged decelerations and variable decelerations during latent phase labor, unable to be corrected by amnioinfusion, and severe enough to prevent proceeding with induction of labor with Pitocin augmentation.  Cervix was still quite firm and fibrotic at 4 cm. Low-grade temperature had developed and patient had been started on antibiotics for suspected III.  Details of procedure patient was taken the operating room prepped and draped for lower abdominal surgery.  Upon my arrival procedure was confirmed, Ancef administered and procedure begun transverse lower abdominal incision was made 3 cm above the symphysis pubis, opened transversely as was the fascia in the midline nasal cavity entry was followed by bladder flap development, Alexis retractor positioned and transverse uterine incision performed to  the level of the fetal cheek.  The baby was in the left occiput posterior position.  Amniotic fluid had no malodor.  Fetal vertex was rotated into the incision and delivered with fundal pressure.  There was some bleeding coming from the left uterine vessels and ring forceps were placed on the bleeding site small amount of ecchymosis developed around that area.  The at 1 minute the cord was clamped and the baby passed to the waiting pediatricians.  See their notes for details Pitocin was administered and the uterus delivered of the placenta by crede uterine massage.  Uterine tone was excellent.  The uterus was swabbed out with dry lap.  A figure-of-eight suture was placed on the left corner to achieve hemostasis in the area of the bruising and the bleeding from the uterine vessel on the left.  The uterus was then closed in 2 layer fashion running locking 0 Monocryl first layer and continuous running 0 Monocryl second layer.  Hemostasis was good.  Pelvis was irrigated.  Laparotomy was closed after removal of all surgical instruments using 2-0 Vicryl on the peritoneum, 0 Vicryl on the fascia, interrupted horizontal mattress sutures of 2 oh plain used in the subcu space and then subcuticular 4-0 Vicryl used for skin closure sponge and needle counts were correct and patient to recovery room in good condition. Since she had a C-section antibiotics will be continued for 24 hours

## 2017-12-10 NOTE — Progress Notes (Signed)
Patient ID: Lindsey GrillDalal Yousif Hines, female   DOB: 08/07/1991, 27 y.o.   MRN: [id number] Comfortable with epidural Has continued to have spontaneous, random decelerations, most of which appear to be variable in nature UCs every 3 min, mild per IUPC  Amnioinfusion going  Dr Emelda FearFerguson in to examine patient and discuss situation  With cervix being unchanged, and difficulty advancing Pitocin due to FHR decels, it is becoming more likely that we may need to proceed to Cesarean Delivery  Will observe for now

## 2017-12-10 NOTE — Anesthesia Postprocedure Evaluation (Signed)
Anesthesia Post Note  Patient: Lindsey Hines  Procedure(s) Performed: CESAREAN SECTION (N/A )     Patient location during evaluation: Mother Baby Anesthesia Type: Epidural Level of consciousness: awake Pain management: satisfactory to patient Vital Signs Assessment: post-procedure vital signs reviewed and stable Respiratory status: spontaneous breathing Cardiovascular status: stable Anesthetic complications: no    Last Vitals:  Vitals:   12/10/17 0927 12/10/17 1130  BP: 98/63   Pulse: 64   Resp: 18 18  Temp: 36.9 C 36.5 C  SpO2: 92% 93%    Last Pain:  Vitals:   12/10/17 1130  TempSrc: Oral  PainSc: 0-No pain   Pain Goal: Patients Stated Pain Goal: 5 (12/09/17 16100738)               Cephus ShellingBURGER,Diarra Ceja

## 2017-12-10 NOTE — Progress Notes (Signed)
[employer] interpreter ID #[id number] used for admission education, discuss plan for the day and order breakfast

## 2017-12-10 NOTE — Op Note (Signed)
Please see the brief operative note for surgical details 

## 2017-12-10 NOTE — Transfer of Care (Signed)
Immediate Anesthesia Transfer of Care Note  Patient: Lindsey Hines  Procedure(s) Performed: CESAREAN SECTION (N/A )  Patient Location: PACU  Anesthesia Type:Epidural  Level of Consciousness: awake, alert  and oriented  Airway & Oxygen Therapy: Patient Spontanous Breathing  Post-op Assessment: Report given to RN and Post -op Vital signs reviewed and stable  Post vital signs: Reviewed and stableBP 104/66, HR 94, RR 21, SaO2 100%  Last Vitals:  Vitals Value Taken Time  BP    Temp    Pulse    Resp    SpO2      Last Pain:  Vitals:   12/09/17 2200  TempSrc:   PainSc: 0-No pain      Patients Stated Pain Goal: 5 (12/09/17 0738)  Complications: No apparent anesthesia complications

## 2017-12-10 NOTE — Progress Notes (Signed)
Pharmacy Antibiotic Note  Lindsey Hines is a 27 y.o. female G2P0010 admitted on 12/08/2017 for IOL due to postdates. She now has an elevated temperature. Pharmacy has been consulted for Gentamicin  dosing.  Plan: Gentamicin loading dose 180 mg IV; then 170 mg IV every 8 hours. Gentamicin level goals: peaks 6-8 mcg/ml; troughs <1 mcg/ml Monitor serum creatinine levels per protocol Serum gentamicin levels as indicated  Height: 5\' 5"  (165.1 cm) Weight: 195 lb 8 oz (88.7 kg) IBW/kg (Calculated) : 57  Temp (24hrs), Avg:97.9 F (36.6 C), Min:97.5 F (36.4 C), Max:98.3 F (36.8 C)  Recent Labs  Lab 12/08/17 0744  WBC 5.0    CrCl cannot be calculated (No order found.).    No Known Allergies  Antimicrobials this admission: Ampicillin 2 Gm IV 12/10/17 >>   Dose adjustments this admission: N/A  Microbiology results:  Thank you for allowing pharmacy to be a part of this patient's care.  Arelia SneddonMason, Jennett Tarbell Anne 12/10/2017 2:38 AM

## 2017-12-10 NOTE — Progress Notes (Signed)
Orthostatic vitals were not completed. Patient felt dizzy and wanted to sit back down. Will try to stand and ambulate at next fundal check.

## 2017-12-10 NOTE — Progress Notes (Addendum)
Patient ID: Lindsey Hines, female   DOB: 09/30/1990, 27 y.o.   MRN: [id number] Comfortable with epidural now  Vitals:   12/09/17 2316 12/09/17 2331 12/09/17 2346 12/10/17 0001  BP: 107/66 106/64 100/64 99/69  Pulse: 75 77 81 79  Resp: 16 16  16   Temp:      TempSrc:      Weight:      Height:       IUPC and ISE inserted  FHR with intermittent decels + accel with scalp stimulation  Dilation: 4 Effacement (%): 70 Cervical Position: Posterior Station: -2, Ballotable Presentation: Vertex Exam by:: Wynelle BourgeoisMarie Olegario Emberson, CNM  Continue to observe.  Will continue Pitocin

## 2017-12-10 NOTE — Lactation Note (Addendum)
This note was copied from a baby's chart. Lactation Consultation Note  Patient Name: Lindsey Hines MVHQI'OToday's Date: 12/10/2017 Reason for consult: Initial assessment;1st time breastfeeding;Term Interpreter [id number] [name] for interpreter at bedside.  Mom speaks Arabic.  Father speaks both [place] and Arabic.  P1 mom with large, soft breast with semi flat, short shafted nipples that flatten with latch.  Mom was attempting to feed but infant was sleeping wrapped in blankets.  LC worked with mom to hand express and colostrum was easily seen in spoon.  LC spoon fed 2 mls to infant.  Encouraged mom to hand express prior to feed and after feed.  Assisted mom with latching infant and infant sucked his tongue and then would tongue thrust so no latch achieved.    Hand expressed 3 ml more and worked on Psychologist, occupationalsuck training.  After several attempts infant began to suck so was tranferred to breast.  Dad was taught how to roll blankets under  Breast to help support during feed and to teacup hold tissue in order to help infant grasp and stay latched.  Mom was placed in laid back and infant latched.  Wide gape noted with suck rhythm  Established but no swallows heard.  Mom states she is comfortable.  Hand pump was used prior to latching to assist in bringing nipple out.    BF brochure and resource sheet given.  Parents know to call out for assistance or questions regarding feeds.  Snappies left in room along with spoon to collect colostrum.   Maternal Data Formula Feeding for Exclusion: No Has patient been taught Hand Expression?: Yes Does the patient have breastfeeding experience prior to this delivery?: No  Feeding Feeding Type: Breast Fed Length of feed: (still at breast nursing after 10 minutes when LC left room)  LATCH Score Latch: Repeated attempts needed to sustain latch, nipple held in mouth throughout feeding, stimulation needed to elicit sucking reflex.  Audible Swallowing: None  Type of Nipple:  Flat(semi flat but compressible but flattens with latch; cannot sustain without teacup hold)  Comfort (Breast/Nipple): Soft / non-tender  Hold (Positioning): Assistance needed to correctly position infant at breast and maintain latch.  LATCH Score: 5  Interventions Interventions: Breast feeding basics reviewed;Assisted with latch;Skin to skin;Breast massage;Hand express;Pre-pump if needed;Breast compression;Adjust position;Support pillows;Position options;Expressed milk  Lactation Tools Discussed/Used Tools: Pump Breast pump type: Manual   Consult Status Consult Status: Follow-up Date: 12/11/17 Follow-up type: In-patient    [name] 12/10/2017, 8:31 PM

## 2017-12-10 NOTE — Progress Notes (Signed)
Lindsey Hines is a 27 y.o. G2P0010 at 1250w2d by  admitted for induction of labor due to Post dates. Due date 12/01/17.  Subjective: /called to review fetal monitoring strip on this patient, who is on day 2 of IOL for postdates. She is s/p Foley bulb cervical ripening.  FHR has been a concern as she has had a spontaneous decel approximately hourly since 9 pm, and has been unable to move forward with oxytocin dosing due to the fetal heart rate concern. Recently the patient has had AROM and placement of IUPC, Digital exam results in FHR accel, so Fetal reserve present.  Amnioinfusion has been completed with no improvement in decel pattern. Temp mildly increased to 99.4, begun on AmP/Gent for suspected III  Objective: BP 108/71   Pulse 70   Temp 97.9 F (36.6 C) (Oral)   Resp 16   Ht 5\' 5"  (1.651 m)   Wt 195 lb 8 oz (88.7 kg)   LMP 02/24/2017 (Exact Date)   BMI 32.53 kg/m  No intake/output data recorded. No intake/output data recorded. BP 108/71   Pulse 70   Temp 97.9 F (36.6 C) (Oral)   Resp 16   Ht 5\' 5"  (1.651 m)   Wt 195 lb 8 oz (88.7 kg)   LMP 02/24/2017 (Exact Date)   BMI 32.53 kg/m   FHT:  FHR: 135 bpm, variability: moderate,  accelerations:  Present,  decelerations:  Present Variable  UC:   irregular, every 5-8 minutes SVE:   Dilation: 4 Effacement (%): 70, 80 Station: -2 Exam by:: Ferganson Very hard firm cervical tissues. Labs: Lab Results  Component Value Date   WBC 5.0 12/08/2017   HGB 13.3 12/08/2017   HCT 38.6 12/08/2017   MCV 93.7 12/08/2017   PLT 222 12/08/2017    Assessment / Plan: Protracted latent phase Fetal intolerance of labor Possible III Labor: unable to advance pitocin without worsening decels Preeclampsia:   Fetal Wellbeing:  Category II Pain Control:  Epidural I/D:  n/a Anticipated MOD:  Cesarean delivery  Concern over fetal tolerance of labor discussed and pt and husband are in agreement with recommendation of cesarean  delivery. Pitocin d/c'd  To OR when available.   Tilda BurrowJohn V Monquie Fulgham 12/10/2017, 3:01 AM

## 2017-12-11 LAB — CBC
HEMATOCRIT: 32.3 % — AB (ref 36.0–46.0)
HEMOGLOBIN: 11.1 g/dL — AB (ref 12.0–15.0)
MCH: 32.6 pg (ref 26.0–34.0)
MCHC: 34.4 g/dL (ref 30.0–36.0)
MCV: 95 fL (ref 78.0–100.0)
Platelets: 203 10*3/uL (ref 150–400)
RBC: 3.4 MIL/uL — AB (ref 3.87–5.11)
RDW: 13.2 % (ref 11.5–15.5)
WBC: 8.7 10*3/uL (ref 4.0–10.5)

## 2017-12-11 NOTE — Plan of Care (Signed)
Progressing appropriately. Encouraged to call for assistance as needed with feeding and for [facility].

## 2017-12-11 NOTE — Lactation Note (Signed)
This note was copied from a baby's chart. Lactation Consultation Note  Patient Name: Boy Darlin DropDalal Hodgdon ZOXWR'UToday's Date: 12/11/2017 Reason for consult: Follow-up assessment   P1, Baby 35 hours old and has not been vigorous at breast. Baby keeps tongue on roof of mouth.  Short labial frenulum and cupped tongue. Assisted w/ latching and baby falls asleep and pushes off breast. Introduced #20NS and set up DEBP. Baby sustained latch for more than 20 min. Prefilled NS with formula.  Reviewed volume guidelines and finger syringe feeding. Mom encouraged to feed baby 8-12 times/24 hours and with feeding cues.  Recommend mother post pump 4-6 times per day for 10-20 min with DEBP on initiation setting. Give baby back volume pumped at the next feeding. Reviewed cleaning and milk storage.     Maternal Data    Feeding Feeding Type: Breast Fed  LATCH Score Latch: Repeated attempts needed to sustain latch, nipple held in mouth throughout feeding, stimulation needed to elicit sucking reflex.  Audible Swallowing: A few with stimulation  Type of Nipple: Everted at rest and after stimulation  Comfort (Breast/Nipple): Soft / non-tender  Hold (Positioning): Assistance needed to correctly position infant at breast and maintain latch.  LATCH Score: 7  Interventions Interventions: Breast feeding basics reviewed;Assisted with latch;Skin to skin;Hand express;Pre-pump if needed;DEBP;Support pillows;Adjust position;Breast compression  Lactation Tools Discussed/Used Tools: Pump;Nipple Shields Nipple shield size: 20 Breast pump type: Double-Electric Breast Pump Pump Review: Setup, frequency, and cleaning;Milk Storage Initiated by:: Dahlia Byesuth Stephonie Wilcoxen RN IBCLC   Consult Status Consult Status: Follow-up Date: 12/12/17 Follow-up type: In-patient    Dahlia ByesBerkelhammer, Kabrina Christiano [facility] 12/11/2017, 4:15 PM

## 2017-12-11 NOTE — Progress Notes (Signed)
Subjective: Postpartum Day 1: Cesarean Delivery Patient reports incisional pain, tolerating PO and no problems voiding.    Objective: Vital signs in last 24 hours: Temp:  [98.2 F (36.8 C)-98.9 F (37.2 C)] 98.2 F (36.8 C) (04/05 0542) Pulse Rate:  [64-86] 64 (04/05 0542) Resp:  [18] 18 (04/05 0542) BP: (94-104)/(48-72) 94/48 (04/05 0542) SpO2:  [64 %-98 %] 64 % (04/05 0542)  Physical Exam:  General: alert, cooperative and no distress Lochia: appropriate Uterine Fundus: firm Incision: healing well, no significant drainage, no significant erythema DVT Evaluation: No evidence of DVT seen on physical exam.  Recent Labs    12/11/17 0542  HGB 11.1*  HCT 32.3*    Assessment/Plan: Status post Cesarean section. Doing well postoperatively.  Continue current care.  Rolm BookbinderCaroline M Sherryn Pollino CNM 12/11/2017, 3:09 PM

## 2017-12-11 NOTE — Progress Notes (Signed)
Patient denied use of interpreter when RN went over plan of care for the day, breakfast order and medications. Patient verbalized understanding and asked appropriate questions.

## 2017-12-11 NOTE — Lactation Note (Signed)
This note was copied from a baby's chart. Lactation Consultation Note  Patient Name: Boy Darlin DropDalal Novella ZOXWR'UToday's Date: 12/11/2017   Baby 30 hours old.  Mother requested assistance w/ breastfeeding. Mother has large breasts.  Placed roll under breast to lift and encouraged mother to support while breastfeeding. Infant latched after a few attempts with wide flanged lips. A few swallows observed w/ stimulation. Encouraged mother not to pull tissue away from breast and compress during feeding. Mom encouraged to feed baby 8-12 times/24 hours and with feeding cues.       Maternal Data    Feeding Feeding Type: Breast Fed Length of feed: 25 min  LATCH Score Latch: Repeated attempts needed to sustain latch, nipple held in mouth throughout feeding, stimulation needed to elicit sucking reflex.  Audible Swallowing: A few with stimulation  Type of Nipple: Flat  Comfort (Breast/Nipple): Soft / non-tender  Hold (Positioning): Assistance needed to correctly position infant at breast and maintain latch.  LATCH Score: 6  Interventions    Lactation Tools Discussed/Used     Consult Status      Dahlia ByesBerkelhammer, Loui Massenburg [facility] 12/11/2017, 10:54 AM

## 2017-12-11 NOTE — Lactation Note (Signed)
This note was copied from a baby's chart. Lactation Consultation Note  Patient Name: Lindsey Hines UJWJX'BToday's Date: 12/11/2017 Reason for consult: Follow-up assessment;1st time breastfeeding;Mother's request;Difficult latch  LC Second Time To Visit Today:  At parent's request LC back into room for another visit after a lengthy visit earlier in the evening.    Infant is now 25 hours of age and still not very interested in breastfeeding.   Mother has flat nipples which are very compressible. No pain noted.  Assisted infant in the football hold on the right breast.  After many attempts he was able to latch on with the teacup hold.  He did not want to suck after latching.  Mother reminded to do breast massage and breast compression during feeds.  Mother's plan is to breast/bottle feed at home.  In order to encourage infant to suck and to get some energy for feeding LC finger fed 10 mls [facility] ([facility] with curve tipped syringe.  Infant was able to suck effectively after 5 mls.  He burped well and slept in mother's arms after this feeding.  Encouraged STS and to feed 8-12 times in 24 hours and to watch for feeding cues.         Maternal Data Has patient been taught Hand Expression?: Yes Does the patient have breastfeeding experience prior to this delivery?: No  Feeding Feeding Type: Breast Fed Length of feed: 15 min  LATCH Score Latch: Repeated attempts needed to sustain latch, nipple held in mouth throughout feeding, stimulation needed to elicit sucking reflex.  Audible Swallowing: None  Type of Nipple: Flat  Comfort (Breast/Nipple): Soft / non-tender  Hold (Positioning): Assistance needed to correctly position infant at breast and maintain latch.  LATCH Score: 5  Interventions Interventions: Breast feeding basics reviewed;Assisted with latch;Skin to skin;Breast massage;Hand express;Position options;Support pillows;Adjust position;Breast compression;Hand  pump  Lactation Tools Discussed/Used Tools: Pump Breast pump type: Manual   Consult Status Consult Status: Follow-up Date: 12/12/17    Lindsey Hines 12/11/2017, 7:05 AM

## 2017-12-12 MED ORDER — OXYCODONE HCL 5 MG PO TABS: 10.0000 mg | ORAL_TABLET | ORAL | Status: DC | PRN

## 2017-12-12 MED ORDER — OXYCODONE HCL 5 MG PO TABS
5.0000 mg | ORAL_TABLET | ORAL | Status: DC | PRN
  Administered 2017-12-12 – 2017-12-13 (×3): 5 mg via ORAL
  Filled 2017-12-12 (×3): qty 1

## 2017-12-12 NOTE — Plan of Care (Signed)
Progressing appropriately. Encouraged to call for assistance as needed with feeding and for LATCH assessment. 

## 2017-12-12 NOTE — Lactation Note (Signed)
This note was copied from a baby's chart. Lactation Consultation Note  Patient Name: Lindsey Darlin DropDalal Jarecki ZOXWR'UToday's Date: 12/12/2017 Reason for consult: Infant weight loss;Follow-up assessment   Baby 56 hours old. Reviewed waking techniques.  Parents responding better to help feed infant. Assisted w/ feeding at breast w/ 5 french feeding tube and #20NS. Baby breastfed for 20 min and took approx 16 ml. Reminded parents to keep baby upright for 15 min after feeding and burp. Mother plans to post pump. Reviewed cleaning of 5 french feeding tube.   Maternal Data    Feeding Feeding Type: Breast Fed Length of feed: 20 min  LATCH Score Latch: Repeated attempts needed to sustain latch, nipple held in mouth throughout feeding, stimulation needed to elicit sucking reflex.  Audible Swallowing: A few with stimulation  Type of Nipple: Everted at rest and after stimulation  Comfort (Breast/Nipple): Soft / non-tender  Hold (Positioning): Assistance needed to correctly position infant at breast and maintain latch.  LATCH Score: 7  Interventions Interventions: Breast feeding basics reviewed;Breast compression;DEBP  Lactation Tools Discussed/Used Tools: Nipple Shields;28F feeding tube / Syringe Nipple shield size: 20 Breast pump type: Double-Electric Breast Pump   Consult Status Consult Status: Follow-up Date: 12/13/17 Follow-up type: In-patient    Dahlia ByesBerkelhammer, Lindsey Hines [facility] 12/12/2017, 12:27 PM

## 2017-12-12 NOTE — Progress Notes (Addendum)
Post Partum Day 2/POD #2 Subjective: no complaints, up ad lib, voiding, tolerating PO, + flatus and pain well managed  Objective: Blood pressure (!) 120/91, pulse 82, temperature 98.6 F (37 C), temperature source Oral, resp. rate 18, height  (1.651 m), weight 195 lb 8 oz (88.7 kg), last menstrual period 02/24/2017, SpO2 (!) 64 %.  Physical Exam:  General: alert, cooperative and no distress Heart: RRR Lungs: CTAB Lochia: appropriate GI: soft, NT, BS x4 Uterine Fundus: firm Incision: healing well, no significant drainage, no dehiscence, no significant erythema, dsg c/d/i DVT Evaluation: No evidence of DVT seen on physical exam. Negative Homan's sign. No cords or calf tenderness. No significant calf/ankle edema.  Recent Labs    12/11/17 0542  HGB 11.1*  HCT 32.3*    Assessment/Plan: S/p Cesarean section/POD #2 Plan for discharge tomorrow   LOS: 4 days   Arabic interpreter present for encounter  Donette Larry, CNM 12/12/2017, 1:22 PM

## 2017-12-12 NOTE — Lactation Note (Signed)
This note was copied from a baby's chart. Lactation Consultation Note  Patient Name: Lindsey Hines WUJWJ'XToday's Date: 12/12/2017 Reason for consult: Follow-up assessment   8.6% weight loss. Baby 4453 hours old and parents have not been waking baby for feedings. Suggest parents put alarm on cell phone to feed at least q 3 hours and with feeding cues.  [name] RN assisting with feeding with #20NS upon entering. NS was prefilled with formula. Recommend mother breastfeed with NS and then give formula supplementation afterwards per volume guidelines 18-25 ml. Noted emesis in crib. Suggest parents hold infant upright for 15 min after feeding. FOB gave baby 18 ml with curved tip syringe.  Assisted mother w/ using DEBP. Encouraged pumping 4-6 times per day and give volume back to baby with the difference w/ formula. Mom encouraged to feed baby 8-12 times/24 hours and with feeding cues.     Maternal Data    Feeding Feeding Type: Breast Fed Length of feed: 7 min  LATCH Score Latch: Repeated attempts needed to sustain latch, nipple held in mouth throughout feeding, stimulation needed to elicit sucking reflex.  Audible Swallowing: A few with stimulation  Type of Nipple: Everted at rest and after stimulation  Comfort (Breast/Nipple): Soft / non-tender  Hold (Positioning): Assistance needed to correctly position infant at breast and maintain latch.  LATCH Score: 7  Interventions Interventions: Assisted with latch;DEBP  Lactation Tools Discussed/Used Tools: Nipple Shields Nipple shield size: 20   Consult Status Consult Status: Follow-up Date: 12/13/17 Follow-up type: In-patient    Dahlia ByesBerkelhammer, Ruth [facility] 12/12/2017, 9:38 AM

## 2017-12-13 ENCOUNTER — Encounter (HOSPITAL_COMMUNITY): Payer: Self-pay | Admitting: *Deleted

## 2017-12-13 MED ORDER — SENNOSIDES-DOCUSATE SODIUM 8.6-50 MG PO TABS: 2.0000 | ORAL_TABLET | ORAL | 1 refills | Status: DC

## 2017-12-13 MED ORDER — OXYCODONE HCL 5 MG PO TABS: 5.0000 mg | ORAL_TABLET | ORAL | 0 refills | Status: DC | PRN

## 2017-12-13 MED ORDER — IBUPROFEN 600 MG PO TABS: 600.0000 mg | ORAL_TABLET | Freq: Four times a day (QID) | ORAL | 0 refills | Status: DC

## 2017-12-13 NOTE — Discharge Summary (Signed)
       OB Discharge Summary  Patient Name: Lindsey GrillDalal Yousif Platts DOB: 05/07/1991 MRN: [id number]  Date of admission: 12/08/2017 Delivering MD: Tilda BurrowFERGUSON, JOHN V   Date of discharge: 12/13/2017  Admitting diagnosis: INDUCTION Intrauterine pregnancy: 4285w5d     Secondary diagnosis:Active Problems:   Post-dates pregnancy  Additional problems:none     Discharge diagnosis: Term Pregnancy Delivered                                                                     Post partum procedures:n/a  Augmentation: Pitocin  Complications: None  Hospital course:  Induction of Labor With Cesarean Section  27 y.o. yo G2P0010 at 2685w5d was admitted to the hospital 12/08/2017 for induction of labor. Patient had a labor course significant for fetal intolerance of labor. The patient went for cesarean section due to Non-Reassuring FHR, and delivered a Viable infant,12/10/2017  Membrane Rupture Time/Date: 1:12 AM ,12/10/2017   Details of operation can be found in separate operative Note.  Patient had an uncomplicated postpartum course. She is ambulating, tolerating a regular diet, passing flatus, and urinating well.  Patient is discharged home in stable condition on 12/13/17.                                    Physical exam  Vitals:   12/11/17 0542 12/11/17 1750 12/12/17 0534 12/12/17 1900  BP: (!) 94/48 98/72 (!) 120/91 121/78  Pulse: 64 70 82 71  Resp: 18 18 18 20   Temp: 98.2 F (36.8 C) 98.8 F (37.1 C) 98.6 F (37 C) 98.6 F (37 C)  TempSrc: Oral Oral Oral Oral  SpO2: (!) 64%     Weight:      Height:       General: alert, cooperative and no distress Lochia: appropriate Uterine Fundus: firm Incision: Healing well with no significant drainage, No significant erythema DVT Evaluation: No evidence of DVT seen on physical exam. Labs: Lab Results  Component Value Date   WBC 8.7 12/11/2017   HGB 11.1 (L) 12/11/2017   HCT 32.3 (L) 12/11/2017   MCV 95.0 12/11/2017   PLT 203 12/11/2017   CMP Latest Ref  Rng & Units 12/10/2017  Creatinine 0.44 - 1.00 mg/dL 7.820.52    Discharge instruction: per After Visit Summary and "Baby and Me Booklet".  After Visit Meds:    Diet: routine diet  Activity: Advance as tolerated. Pelvic rest for 6 weeks.   Outpatient follow up:6 weeks Follow up Appt:No future appointments. Follow up visit: No follow-ups on file.  Postpartum contraception: Undecided  Newborn Data: Live born female  Birth Weight: 7 lb 5.6 oz (3335 g) APGAR: 9, 9  Newborn Delivery   Birth date/time:  12/10/2017 04:23:00 Delivery type:  C-Section, Low Transverse C-section categorization:  Primary     Baby Feeding: Breast Disposition:home with mother   12/13/2017 Wyvonnia DuskyMarie Lawson, CNM

## 2017-12-13 NOTE — Lactation Note (Addendum)
This note was copied from a baby's chart. Lactation Consultation Note  Patient Name: Lindsey Hines Reason for consult: Follow-up assessment  FOB helps interpret. Baby 77 hours old.  Baby sleepy.  Changed diaper to wake. Mother attempted latching baby without nipple shield on R nipple but baby is not getting enough depth without NS.   Applied #20NS and baby sustained latch for 15 min on  R side and then was able to latch without nipple shield on L side for 12 more minutes with lips flanged.  After having viewed baby tongue thrusting initially and not able to sustain latch, baby is now doing much better.  Family will still need guidance but feeding is much improved.  Colostrum viewed in nipple shield after bf on R side.  Encouraged using the breastmilk first. Reminded parents of milk storage guidelines and reviewed on pg. 36.  Family will call to do pre and post weight today and [facility]. Put in message for OP appointment. Reviewed engorgement care. Mother pumped 28 ml which she will give after breastfeeding.  Plan for parents: (given in written format to parents). 1.  Feed your baby on demand with feeding cues 8-12 times per day, but at least every 3 hours. 2. Watch that he is voiding and stooling frequently.   3. If you baby is not latching deep on the breast, use the #20 nipple shield to help him sustain a deep latch. 4. Breastfeed on both breasts if he is willing.  5. After breastfeeding, give baby pumped breastmilk and if he is still hungry give him formula. You can use slow flow bottle to give him extra.  Be sure to take breaks when giving him a bottle and burp him so he does not get the whole volume at once.  The baby can have 30-60 ml of extra depending on his hunger.  Increase his volume as he desires.  Be sure to burp him afterwards and hold him upright for 15 minutes before putting him down. 6. Pump after every other feeding and give the volume  pumped to him at next feeding if baby desires.       Maternal Data    Feeding Feeding Type: Breast Fed  LATCH Score Latch: Repeated attempts needed to sustain latch, nipple held in mouth throughout feeding, stimulation needed to elicit sucking reflex.  Audible Swallowing: A few with stimulation  Type of Nipple: Everted at rest and after stimulation  Comfort (Breast/Nipple): Soft / non-tender  Hold (Positioning): Assistance needed to correctly position infant at breast and maintain latch.  LATCH Score: 7  Interventions Interventions: Breast feeding basics reviewed;Assisted with latch;DEBP  Lactation Tools Discussed/Used Tools: Nipple Shields Nipple shield size: 20 Breast pump type: Double-Electric Breast Pump   Consult Status Consult Status: Follow-up Date: 12/14/17 Follow-up type: Out-patient    Dahlia ByesBerkelhammer, Lindsey Hines Hines, 9:27 AM

## 2020-12-21 ENCOUNTER — Inpatient Hospital Stay (HOSPITAL_COMMUNITY)
Admission: AD | Admit: 2020-12-21 | Discharge: 2020-12-21 | Disposition: A | Discharge status: Home / Self Care | Payer: BLUE CROSS/BLUE SHIELD | Attending: Obstetrics & Gynecology | Admitting: Obstetrics & Gynecology

## 2020-12-21 ENCOUNTER — Other Ambulatory Visit: Payer: Self-pay

## 2020-12-21 ENCOUNTER — Encounter (HOSPITAL_COMMUNITY): Payer: Self-pay | Admitting: Obstetrics & Gynecology

## 2020-12-21 DIAGNOSIS — O99513 Diseases of the respiratory system complicating pregnancy, third trimester: Secondary | ICD-10-CM | POA: Insufficient documentation

## 2020-12-21 DIAGNOSIS — J0100 Acute maxillary sinusitis, unspecified: Secondary | ICD-10-CM | POA: Insufficient documentation

## 2020-12-21 DIAGNOSIS — O26893 Other specified pregnancy related conditions, third trimester: Secondary | ICD-10-CM | POA: Insufficient documentation

## 2020-12-21 DIAGNOSIS — R059 Cough, unspecified: Secondary | ICD-10-CM | POA: Insufficient documentation

## 2020-12-21 DIAGNOSIS — R6884 Jaw pain: Secondary | ICD-10-CM | POA: Insufficient documentation

## 2020-12-21 DIAGNOSIS — M549 Dorsalgia, unspecified: Secondary | ICD-10-CM | POA: Diagnosis not present

## 2020-12-21 DIAGNOSIS — Z3A39 39 weeks gestation of pregnancy: Secondary | ICD-10-CM | POA: Diagnosis not present

## 2020-12-21 LAB — COMPREHENSIVE METABOLIC PANEL
ALT: 12 U/L (ref 0–44)
AST: 23 U/L (ref 15–41)
Albumin: 2.4 g/dL — ABNORMAL LOW (ref 3.5–5.0)
Alkaline Phosphatase: 237 U/L — ABNORMAL HIGH (ref 38–126)
Anion gap: 9 (ref 5–15)
BUN: 5 mg/dL — ABNORMAL LOW (ref 6–20)
CO2: 20 mmol/L — ABNORMAL LOW (ref 22–32)
Calcium: 8.7 mg/dL — ABNORMAL LOW (ref 8.9–10.3)
Chloride: 106 mmol/L (ref 98–111)
Creatinine, Ser: 0.56 mg/dL (ref 0.44–1.00)
GFR, Estimated: >60 mL/min (ref >60)
Glucose, Bld: 92 mg/dL (ref 70–99)
Potassium: 4.2 mmol/L (ref 3.5–5.1)
Sodium: 135 mmol/L (ref 135–145)
Total Bilirubin: 0.4 mg/dL (ref 0.3–1.2)
Total Protein: 6.2 g/dL — ABNORMAL LOW (ref 6.5–8.1)

## 2020-12-21 LAB — URINALYSIS, ROUTINE W REFLEX MICROSCOPIC
Bilirubin Urine: NEGATIVE
Glucose, UA: NEGATIVE mg/dL
Hgb urine dipstick: NEGATIVE
Ketones, ur: NEGATIVE mg/dL
Leukocytes,Ua: NEGATIVE
Nitrite: NEGATIVE
Protein, ur: NEGATIVE mg/dL
Specific Gravity, Urine: 1.018 (ref 1.005–1.030)
pH: 5 (ref 5.0–8.0)

## 2020-12-21 LAB — CBC WITH DIFFERENTIAL/PLATELET
Abs Immature Granulocytes: 0.15 10*3/uL — ABNORMAL HIGH (ref 0.00–0.07)
Basophils Absolute: 0 10*3/uL (ref 0.0–0.1)
Basophils Relative: 0 %
Eosinophils Absolute: 0 10*3/uL (ref 0.0–0.5)
Eosinophils Relative: 0 %
HCT: 37.2 % (ref 36.0–46.0)
Hemoglobin: 12.5 g/dL (ref 12.0–15.0)
Immature Granulocytes: 2 %
Lymphocytes Relative: 17 %
Lymphs Abs: 1.3 10*3/uL (ref 0.7–4.0)
MCH: 31.1 pg (ref 26.0–34.0)
MCHC: 33.6 g/dL (ref 30.0–36.0)
MCV: 92.5 fL (ref 80.0–100.0)
Monocytes Absolute: 0.6 10*3/uL (ref 0.1–1.0)
Monocytes Relative: 8 %
Neutro Abs: 5.4 10*3/uL (ref 1.7–7.7)
Neutrophils Relative %: 73 %
Platelets: 223 10*3/uL (ref 150–400)
RBC: 4.02 MIL/uL (ref 3.87–5.11)
RDW: 13.2 % (ref 11.5–15.5)
WBC: 7.5 10*3/uL (ref 4.0–10.5)
nRBC: 0 % (ref 0.0–0.2)

## 2020-12-21 MED ORDER — ACETAMINOPHEN 500 MG PO TABS: 1000.0000 mg | ORAL_TABLET | Freq: Two times a day (BID) | ORAL | 0 refills | Status: DC | PRN

## 2020-12-21 MED ORDER — GUAIFENESIN ER 600 MG PO TB12: 600.0000 mg | ORAL_TABLET | Freq: Two times a day (BID) | ORAL | 0 refills | Status: AC | PRN

## 2020-12-21 MED ORDER — GUAIFENESIN ER 600 MG PO TB12
600.0000 mg | ORAL_TABLET | Freq: Once | ORAL | Status: AC
  Administered 2020-12-21: 600 mg via ORAL
  Filled 2020-12-21: qty 1

## 2020-12-21 MED ORDER — LACTATED RINGERS IV BOLUS
1000.0000 mL | Freq: Once | INTRAVENOUS | Status: AC
  Administered 2020-12-21: 1000 mL via INTRAVENOUS

## 2020-12-21 MED ORDER — ACETAMINOPHEN 500 MG PO TABS
1000.0000 mg | ORAL_TABLET | Freq: Once | ORAL | Status: AC
  Administered 2020-12-21: 1000 mg via ORAL
  Filled 2020-12-21: qty 2

## 2020-12-21 MED ORDER — CYCLOBENZAPRINE HCL 5 MG PO TABS
10.0000 mg | ORAL_TABLET | Freq: Once | ORAL | Status: AC
  Administered 2020-12-21: 10 mg via ORAL
  Filled 2020-12-21: qty 2

## 2020-12-21 MED ORDER — CETIRIZINE HCL 10 MG PO TABS: 10.0000 mg | ORAL_TABLET | Freq: Every day | ORAL | 0 refills | Status: AC

## 2020-12-21 NOTE — MAU Provider Note (Signed)
Chief Complaint:  Headache, Generalized Body Aches, Back Pain, Jaw Pain, and Cough   Event Date/Time   First Provider Initiated Contact with Patient 12/21/20 1327     HPI: Lindsey Hines is a 30 y.o. G3P0010 at [redacted]w[redacted]d who presents to maternity admissions reporting respiratory symptoms. 10 days ago, she began feeling mildly congested with a mild cough due to nasal drainage. Has lived in [place] for four years and never had problems with allergies. Yesterday, the congestion got worse and included a headache that is temporarily relieved by Tylenol, worsened cough, sinus and jaw pain. Has been seen by a dentist in the last 80mo, no dental caries or previous tooth pain. No wheezing, SOB or chest pain. Has been afebrile but also feels generally achy. Denies vaginal bleeding, leaking of fluid, decreased fetal movement, fever, falls, or other recent illness.   Pregnancy Course: Receives prenatal care at [facility]  Past Medical History:  Diagnosis Date  . Medical history non-contributory    OB History  Gravida Para Term Preterm AB Living  3       1    SAB IAB Ectopic Multiple Live Births  1            # Outcome Date GA Lbr Len/2nd Weight Sex Delivery Anes PTL Lv  3 Current           2 SAB           1 Gravida            Past Surgical History:  Procedure Laterality Date  . CESAREAN SECTION N/A 12/10/2017   Procedure: CESAREAN SECTION;  Surgeon: Tilda Burrow, MD;  Location: [facility] BIRTHING SUITES;  Service: Obstetrics;  Laterality: N/A;  . NO PAST SURGERIES     No family history on file. Social History   Tobacco Use  . Smoking status: Never Smoker  . Smokeless tobacco: Never Used  Substance Use Topics  . Alcohol use: No  . Drug use: No   No Known Allergies No medications prior to admission.   I have reviewed patient's Past Medical Hx, Surgical Hx, Family Hx, Social Hx, medications and allergies.   ROS:  Review of Systems  Constitutional: Positive for fatigue.  Negative for fever.  HENT: Positive for congestion, postnasal drip, sinus pressure and sinus pain. Negative for dental problem (was seen by her dentist 6mo ago, no caries), facial swelling, sneezing and sore throat.   Eyes: Negative for visual disturbance.  Respiratory: Positive for cough. Negative for chest tightness, shortness of breath and wheezing.   Cardiovascular: Negative for chest pain.  Gastrointestinal: Negative for nausea and vomiting.  Genitourinary: Negative for flank pain, pelvic pain, vaginal bleeding and vaginal discharge.  Neurological: Positive for headaches. Negative for dizziness, syncope and light-headedness.  All other systems reviewed and are negative.  Physical Exam   Patient Vitals for the past 24 hrs:  BP Temp Temp src Pulse Resp SpO2 Height Weight  12/21/20 1530 127/77 99.1 F (37.3 C) Oral 94 17 100 % -- --  12/21/20 1237 125/83 98.6 F (37 C) Oral 99 18 98 % 5\' 5"  (1.651 m) 206 lb 8 oz (93.7 kg)  12/21/20 1233 -- -- -- -- -- 100 % -- --   Constitutional: Well-developed, well-nourished female in no acute distress.  HEENT: PERRL, mild tenderness on face, forehead and temples above sinuses Cardiovascular: normal rate & rhythm, no murmur Respiratory: normal effort, lung sounds clear throughout GI: Abd soft, non-tender, gravid appropriate for  gestational age. Pos BS x 4 MS: Extremities nontender, no edema, normal ROM Neurologic: Alert and oriented x 4.  GU: no CVA tenderness Speculum exam deferred  (Unchanged from last check in office) Dilation: 3 Effacement (%): 60 Cervical Position: Posterior Station: -3 Presentation: Vertex Exam by:: Ginnie Smart RN  Fetal Tracing: reactive Baseline: 150 Variability: moderate Accelerations: 15x15 Decelerations: none Toco: mild q4-75min lasting less than a minute (pt rates these as Braxton-Hicks)   Labs: Results for orders placed or performed during the hospital encounter of 12/21/20 (from the past 24 hour(s))   Urinalysis, Routine w reflex microscopic Urine, Clean Catch     Status: Abnormal   Collection Time: 12/21/20 12:40 PM  Result Value Ref Range   Color, Urine AMBER (A) YELLOW   APPearance CLOUDY (A) CLEAR   Specific Gravity, Urine 1.018 1.005 - 1.030   pH 5.0 5.0 - 8.0   Glucose, UA NEGATIVE NEGATIVE mg/dL   Hgb urine dipstick NEGATIVE NEGATIVE   Bilirubin Urine NEGATIVE NEGATIVE   Ketones, ur NEGATIVE NEGATIVE mg/dL   Protein, ur NEGATIVE NEGATIVE mg/dL   Nitrite NEGATIVE NEGATIVE   Leukocytes,Ua NEGATIVE NEGATIVE  CBC with Differential/Platelet     Status: Abnormal   Collection Time: 12/21/20  1:57 PM  Result Value Ref Range   WBC 7.5 4.0 - 10.5 K/uL   RBC 4.02 3.87 - 5.11 MIL/uL   Hemoglobin 12.5 12.0 - 15.0 g/dL   HCT 28.3 15.1 - 76.1 %   MCV 92.5 80.0 - 100.0 fL   MCH 31.1 26.0 - 34.0 pg   MCHC 33.6 30.0 - 36.0 g/dL   RDW 60.7 37.1 - 06.2 %   Platelets 223 150 - 400 K/uL   nRBC 0.0 0.0 - 0.2 %   Neutrophils Relative % 73 %   Neutro Abs 5.4 1.7 - 7.7 K/uL   Lymphocytes Relative 17 %   Lymphs Abs 1.3 0.7 - 4.0 K/uL   Monocytes Relative 8 %   Monocytes Absolute 0.6 0.1 - 1.0 K/uL   Eosinophils Relative 0 %   Eosinophils Absolute 0.0 0.0 - 0.5 K/uL   Basophils Relative 0 %   Basophils Absolute 0.0 0.0 - 0.1 K/uL   Immature Granulocytes 2 %   Abs Immature Granulocytes 0.15 (H) 0.00 - 0.07 K/uL  Comprehensive metabolic panel     Status: Abnormal   Collection Time: 12/21/20  1:57 PM  Result Value Ref Range   Sodium 135 135 - 145 mmol/L   Potassium 4.2 3.5 - 5.1 mmol/L   Chloride 106 98 - 111 mmol/L   CO2 20 (L) 22 - 32 mmol/L   Glucose, Bld 92 70 - 99 mg/dL   BUN 5 (L) 6 - 20 mg/dL   Creatinine, Ser 6.94 0.44 - 1.00 mg/dL   Calcium 8.7 (L) 8.9 - 10.3 mg/dL   Total Protein 6.2 (L) 6.5 - 8.1 g/dL   Albumin 2.4 (L) 3.5 - 5.0 g/dL   AST 23 15 - 41 U/L   ALT 12 0 - 44 U/L   Alkaline Phosphatase 237 (H) 38 - 126 U/L   Total Bilirubin 0.4 0.3 - 1.2 mg/dL   GFR,  Estimated >85 >46 mL/min   Anion gap 9 5 - 15   Imaging:  No results found.  MAU Course: Orders Placed This Encounter  Procedures  . Urinalysis, Routine w reflex microscopic Urine, Clean Catch  . CBC with Differential/Platelet  . Comprehensive metabolic panel  . Discharge patient   Meds ordered this  encounter  Medications  . lactated ringers bolus 1,000 mL  . acetaminophen (TYLENOL) tablet 1,000 mg  . cyclobenzaprine (FLEXERIL) tablet 10 mg  . guaiFENesin (MUCINEX) 12 hr tablet 600 mg  . guaiFENesin (MUCINEX) 600 MG 12 hr tablet    Sig: Take 1 tablet (600 mg total) by mouth 2 (two) times daily as needed.    Dispense:  30 tablet    Refill:  0    Order Specific Question:   Supervising Provider    Answer:   Reva Bores [[id number]]  . cetirizine (ZYRTEC) 10 MG tablet    Sig: Take 1 tablet (10 mg total) by mouth daily.    Dispense:  30 tablet    Refill:  0    Order Specific Question:   Supervising Provider    Answer:   Reva Bores [[id number]]  . acetaminophen (TYLENOL) 500 MG tablet    Sig: Take 2 tablets (1,000 mg total) by mouth 2 (two) times daily as needed for headache.    Dispense:  30 tablet    Refill:  0    Order Specific Question:   Supervising Provider    Answer:   Reva Bores [[id number]]   MDM: LR bolus, tylenol, flexeril and mucinex relieved sinus pressure and headache to 1/10 pain. Lab work normal Discussed likelihood of this being due to seasonal allergies and suggested starting a nightly allergy pill. Pt amenable to plan.   Assessment: 1. Acute non-recurrent maxillary sinusitis    Plan: Discharge home in stable condition with term labor precautions.   Follow up at [facility] as scheduled for ongoing prenatal care  Allergies as of 12/21/2020   No Known Allergies     Medication List    STOP taking these medications   ibuprofen 600 MG tablet Commonly known as: ADVIL   oxyCODONE 5 MG immediate release tablet Commonly known as: Oxy  IR/ROXICODONE   senna-docusate 8.6-50 MG tablet Commonly known as: Senokot-S     TAKE these medications   acetaminophen 500 MG tablet Commonly known as: TYLENOL Take 2 tablets (1,000 mg total) by mouth 2 (two) times daily as needed for headache.   cetirizine 10 MG tablet Commonly known as: ZYRTEC Take 1 tablet (10 mg total) by mouth daily.   guaiFENesin 600 MG 12 hr tablet Commonly known as: Mucinex Take 1 tablet (600 mg total) by mouth 2 (two) times daily as needed.   PRENATAL VITAMIN PO Take 1 tablet by mouth daily.   simethicone 80 MG chewable tablet Commonly known as: MYLICON Chew 80 mg by mouth every 6 (six) hours as needed for flatulence.      Edd Arbour, CNM, MSN, IBCLC Certified Nurse Midwife, [facility]

## 2020-12-21 NOTE — MAU Note (Addendum)
HA started 3 days ago, takes TYlenol, goes away and then comes back.  Having muscle pain and fatigue, started 3 days ago. Having back pain, comes and goes.  Pain in upper and lower job on rt side. Denies fever. Has productive cough, started 10 days ago. Denies loss of taste or smell.  Baby not moving as much.

## 2020-12-24 ENCOUNTER — Other Ambulatory Visit: Payer: Self-pay | Admitting: Obstetrics and Gynecology

## 2020-12-25 ENCOUNTER — Other Ambulatory Visit: Payer: Self-pay

## 2020-12-25 ENCOUNTER — Inpatient Hospital Stay (HOSPITAL_COMMUNITY): Payer: BLUE CROSS/BLUE SHIELD

## 2020-12-25 ENCOUNTER — Inpatient Hospital Stay (HOSPITAL_COMMUNITY)
Admission: RE | Admit: 2020-12-25 | Discharge: 2020-12-28 | DRG: 788 | Disposition: A | Discharge status: Home / Self Care | Payer: BLUE CROSS/BLUE SHIELD | Attending: Obstetrics and Gynecology | Admitting: Obstetrics and Gynecology

## 2020-12-25 ENCOUNTER — Encounter (HOSPITAL_COMMUNITY)
Admission: RE | Disposition: A | Discharge status: Home / Self Care | Payer: Self-pay | Source: Home / Self Care | Attending: Obstetrics and Gynecology

## 2020-12-25 ENCOUNTER — Encounter (HOSPITAL_COMMUNITY): Payer: Self-pay | Admitting: Obstetrics and Gynecology

## 2020-12-25 DIAGNOSIS — R059 Cough, unspecified: Secondary | ICD-10-CM | POA: Diagnosis present

## 2020-12-25 DIAGNOSIS — O99214 Obesity complicating childbirth: Secondary | ICD-10-CM | POA: Diagnosis present

## 2020-12-25 DIAGNOSIS — O34211 Maternal care for low transverse scar from previous cesarean delivery: Secondary | ICD-10-CM | POA: Diagnosis present

## 2020-12-25 DIAGNOSIS — Z3A40 40 weeks gestation of pregnancy: Secondary | ICD-10-CM | POA: Diagnosis not present

## 2020-12-25 DIAGNOSIS — O99892 Other specified diseases and conditions complicating childbirth: Secondary | ICD-10-CM | POA: Diagnosis present

## 2020-12-25 DIAGNOSIS — Z20822 Contact with and (suspected) exposure to covid-19: Secondary | ICD-10-CM | POA: Diagnosis present

## 2020-12-25 LAB — TYPE AND SCREEN
ABO/RH(D): O POS
Antibody Screen: NEGATIVE

## 2020-12-25 LAB — BASIC METABOLIC PANEL
Anion gap: 8 (ref 5–15)
BUN: <5 mg/dL — ABNORMAL LOW (ref 6–20)
CO2: 22 mmol/L (ref 22–32)
Calcium: 8.6 mg/dL — ABNORMAL LOW (ref 8.9–10.3)
Chloride: 104 mmol/L (ref 98–111)
Creatinine, Ser: 0.61 mg/dL (ref 0.44–1.00)
GFR, Estimated: >60 mL/min (ref >60)
Glucose, Bld: 81 mg/dL (ref 70–99)
Potassium: 4 mmol/L (ref 3.5–5.1)
Sodium: 134 mmol/L — ABNORMAL LOW (ref 135–145)

## 2020-12-25 LAB — CBC
HCT: 36.8 % (ref 36.0–46.0)
Hemoglobin: 12.2 g/dL (ref 12.0–15.0)
MCH: 30.8 pg (ref 26.0–34.0)
MCHC: 33.2 g/dL (ref 30.0–36.0)
MCV: 92.9 fL (ref 80.0–100.0)
Platelets: 278 10*3/uL (ref 150–400)
RBC: 3.96 MIL/uL (ref 3.87–5.11)
RDW: 13.2 % (ref 11.5–15.5)
WBC: 6.4 10*3/uL (ref 4.0–10.5)
nRBC: 0 % (ref 0.0–0.2)

## 2020-12-25 LAB — RESP PANEL BY RT-PCR (FLU A&B, COVID) ARPGX2
Influenza A by PCR: NEGATIVE
Influenza B by PCR: NEGATIVE
SARS Coronavirus 2 by RT PCR: NEGATIVE

## 2020-12-25 SURGERY — Surgical Case
Anesthesia: Spinal

## 2020-12-25 MED ORDER — SIMETHICONE 80 MG PO CHEW
80.0000 mg | CHEWABLE_TABLET | Freq: Three times a day (TID) | ORAL | Status: DC
  Administered 2020-12-26 – 2020-12-28 (×7): 80 mg via ORAL
  Filled 2020-12-25 (×7): qty 1

## 2020-12-25 MED ORDER — BUPIVACAINE IN DEXTROSE 0.75-8.25 % IT SOLN
INTRATHECAL | Status: DC | PRN
  Administered 2020-12-25: 1.6 mL via INTRATHECAL

## 2020-12-25 MED ORDER — PRENATAL MULTIVITAMIN CH
1.0000 | ORAL_TABLET | Freq: Every day | ORAL | Status: DC
  Administered 2020-12-26 – 2020-12-28 (×3): 1 via ORAL
  Filled 2020-12-25 (×3): qty 1

## 2020-12-25 MED ORDER — STERILE WATER FOR IRRIGATION IR SOLN
Status: DC | PRN
  Administered 2020-12-25: 1000 mL

## 2020-12-25 MED ORDER — NALBUPHINE HCL 10 MG/ML IJ SOLN: 5.0000 mg | INTRAMUSCULAR | Status: DC | PRN

## 2020-12-25 MED ORDER — TETANUS-DIPHTH-ACELL PERTUSSIS 5-2.5-18.5 LF-MCG/0.5 IM SUSY: 0.5000 mL | PREFILLED_SYRINGE | Freq: Once | INTRAMUSCULAR | Status: DC

## 2020-12-25 MED ORDER — MORPHINE SULFATE (PF) 0.5 MG/ML IJ SOLN
INTRAMUSCULAR | Status: AC
  Filled 2020-12-25: qty 10

## 2020-12-25 MED ORDER — LACTATED RINGERS IV SOLN: INTRAVENOUS | Status: DC

## 2020-12-25 MED ORDER — IBUPROFEN 800 MG PO TABS
800.0000 mg | ORAL_TABLET | Freq: Four times a day (QID) | ORAL | Status: DC
  Administered 2020-12-26 – 2020-12-28 (×8): 800 mg via ORAL
  Filled 2020-12-25: qty 1
  Filled 2020-12-25: qty 4
  Filled 2020-12-25 (×7): qty 1

## 2020-12-25 MED ORDER — METOCLOPRAMIDE HCL 5 MG/ML IJ SOLN
INTRAMUSCULAR | Status: AC
  Filled 2020-12-25: qty 2

## 2020-12-25 MED ORDER — FENTANYL CITRATE (PF) 100 MCG/2ML IJ SOLN
INTRAMUSCULAR | Status: AC
  Filled 2020-12-25: qty 2

## 2020-12-25 MED ORDER — FENTANYL CITRATE (PF) 100 MCG/2ML IJ SOLN
INTRAMUSCULAR | Status: DC | PRN
  Administered 2020-12-25: 15 ug via INTRATHECAL

## 2020-12-25 MED ORDER — DEXAMETHASONE SODIUM PHOSPHATE 4 MG/ML IJ SOLN
INTRAMUSCULAR | Status: DC | PRN
  Administered 2020-12-25: 8 mg via INTRAVENOUS

## 2020-12-25 MED ORDER — NALOXONE HCL 0.4 MG/ML IJ SOLN: 0.4000 mg | INTRAMUSCULAR | Status: DC | PRN

## 2020-12-25 MED ORDER — MEPERIDINE HCL 25 MG/ML IJ SOLN: 6.2500 mg | INTRAMUSCULAR | Status: DC | PRN

## 2020-12-25 MED ORDER — OXYCODONE HCL 5 MG PO TABS
5.0000 mg | ORAL_TABLET | ORAL | Status: DC | PRN
  Administered 2020-12-26 – 2020-12-27 (×2): 5 mg via ORAL
  Administered 2020-12-27: 10 mg via ORAL
  Filled 2020-12-25: qty 2
  Filled 2020-12-25 (×2): qty 1

## 2020-12-25 MED ORDER — ONDANSETRON HCL 4 MG/2ML IJ SOLN: 4.0000 mg | Freq: Three times a day (TID) | INTRAMUSCULAR | Status: DC | PRN

## 2020-12-25 MED ORDER — ONDANSETRON HCL 4 MG/2ML IJ SOLN
INTRAMUSCULAR | Status: AC
  Filled 2020-12-25: qty 2

## 2020-12-25 MED ORDER — KETOROLAC TROMETHAMINE 30 MG/ML IJ SOLN: 30.0000 mg | Freq: Four times a day (QID) | INTRAMUSCULAR | Status: AC | PRN

## 2020-12-25 MED ORDER — DIPHENHYDRAMINE HCL 25 MG PO CAPS: 25.0000 mg | ORAL_CAPSULE | ORAL | Status: DC | PRN

## 2020-12-25 MED ORDER — CEFAZOLIN SODIUM-DEXTROSE 2-3 GM-%(50ML) IV SOLR
INTRAVENOUS | Status: DC | PRN
  Administered 2020-12-25: 2 g via INTRAVENOUS

## 2020-12-25 MED ORDER — SODIUM CHLORIDE 0.9% FLUSH: 3.0000 mL | INTRAVENOUS | Status: DC | PRN

## 2020-12-25 MED ORDER — METOCLOPRAMIDE HCL 5 MG/ML IJ SOLN
INTRAMUSCULAR | Status: DC | PRN
  Administered 2020-12-25 (×2): 5 mg via INTRAVENOUS

## 2020-12-25 MED ORDER — KETOROLAC TROMETHAMINE 30 MG/ML IJ SOLN
INTRAMUSCULAR | Status: AC
  Filled 2020-12-25: qty 1

## 2020-12-25 MED ORDER — WITCH HAZEL-GLYCERIN EX PADS: 1.0000 "application " | MEDICATED_PAD | CUTANEOUS | Status: DC | PRN

## 2020-12-25 MED ORDER — SENNOSIDES-DOCUSATE SODIUM 8.6-50 MG PO TABS
2.0000 | ORAL_TABLET | Freq: Every day | ORAL | Status: DC
  Administered 2020-12-26 – 2020-12-28 (×3): 2 via ORAL
  Filled 2020-12-25 (×3): qty 2

## 2020-12-25 MED ORDER — SOD CITRATE-CITRIC ACID 500-334 MG/5ML PO SOLN
30.0000 mL | Freq: Once | ORAL | Status: AC
  Administered 2020-12-25: 30 mL via ORAL

## 2020-12-25 MED ORDER — OXYTOCIN-SODIUM CHLORIDE 30-0.9 UT/500ML-% IV SOLN: 2.5000 [IU]/h | INTRAVENOUS | Status: AC

## 2020-12-25 MED ORDER — DIBUCAINE (PERIANAL) 1 % EX OINT: 1.0000 "application " | TOPICAL_OINTMENT | CUTANEOUS | Status: DC | PRN

## 2020-12-25 MED ORDER — SOD CITRATE-CITRIC ACID 500-334 MG/5ML PO SOLN
ORAL | Status: AC
  Filled 2020-12-25: qty 30

## 2020-12-25 MED ORDER — ZOLPIDEM TARTRATE 5 MG PO TABS: 5.0000 mg | ORAL_TABLET | Freq: Every evening | ORAL | Status: DC | PRN

## 2020-12-25 MED ORDER — KETOROLAC TROMETHAMINE 30 MG/ML IJ SOLN
30.0000 mg | Freq: Four times a day (QID) | INTRAMUSCULAR | Status: AC | PRN
  Administered 2020-12-25: 30 mg via INTRAVENOUS

## 2020-12-25 MED ORDER — SCOPOLAMINE 1 MG/3DAYS TD PT72
MEDICATED_PATCH | TRANSDERMAL | Status: AC
  Filled 2020-12-25: qty 1

## 2020-12-25 MED ORDER — ONDANSETRON HCL 4 MG/2ML IJ SOLN
INTRAMUSCULAR | Status: DC | PRN
  Administered 2020-12-25: 4 mg via INTRAVENOUS

## 2020-12-25 MED ORDER — COCONUT OIL OIL
1.0000 "application " | TOPICAL_OIL | Status: DC | PRN
  Administered 2020-12-27: 1 via TOPICAL

## 2020-12-25 MED ORDER — DIPHENHYDRAMINE HCL 50 MG/ML IJ SOLN: 12.5000 mg | INTRAMUSCULAR | Status: DC | PRN

## 2020-12-25 MED ORDER — LACTATED RINGERS IV SOLN: INTRAVENOUS | Status: DC | PRN

## 2020-12-25 MED ORDER — DIPHENHYDRAMINE HCL 25 MG PO CAPS: 25.0000 mg | ORAL_CAPSULE | Freq: Four times a day (QID) | ORAL | Status: DC | PRN

## 2020-12-25 MED ORDER — OXYTOCIN-SODIUM CHLORIDE 30-0.9 UT/500ML-% IV SOLN
INTRAVENOUS | Status: DC | PRN
  Administered 2020-12-25: 300 mL via INTRAVENOUS

## 2020-12-25 MED ORDER — ACETAMINOPHEN 500 MG PO TABS
1000.0000 mg | ORAL_TABLET | Freq: Four times a day (QID) | ORAL | Status: DC
  Administered 2020-12-25 – 2020-12-28 (×11): 1000 mg via ORAL
  Filled 2020-12-25 (×11): qty 2

## 2020-12-25 MED ORDER — CEFAZOLIN SODIUM-DEXTROSE 2-4 GM/100ML-% IV SOLN
INTRAVENOUS | Status: AC
  Filled 2020-12-25: qty 100

## 2020-12-25 MED ORDER — SODIUM CHLORIDE 0.9 % IR SOLN
Status: DC | PRN
  Administered 2020-12-25: 1

## 2020-12-25 MED ORDER — CEFAZOLIN SODIUM-DEXTROSE 2-4 GM/100ML-% IV SOLN: 2.0000 g | INTRAVENOUS | Status: DC

## 2020-12-25 MED ORDER — NALBUPHINE HCL 10 MG/ML IJ SOLN: 5.0000 mg | Freq: Once | INTRAMUSCULAR | Status: DC | PRN

## 2020-12-25 MED ORDER — SIMETHICONE 80 MG PO CHEW
80.0000 mg | CHEWABLE_TABLET | ORAL | Status: DC | PRN
  Administered 2020-12-28: 80 mg via ORAL
  Filled 2020-12-25: qty 1

## 2020-12-25 MED ORDER — PHENYLEPHRINE 40 MCG/ML (10ML) SYRINGE FOR IV PUSH (FOR BLOOD PRESSURE SUPPORT)
PREFILLED_SYRINGE | INTRAVENOUS | Status: AC
  Filled 2020-12-25: qty 10

## 2020-12-25 MED ORDER — KETOROLAC TROMETHAMINE 30 MG/ML IJ SOLN
30.0000 mg | Freq: Four times a day (QID) | INTRAMUSCULAR | Status: AC
  Administered 2020-12-25 – 2020-12-26 (×3): 30 mg via INTRAVENOUS
  Filled 2020-12-25 (×3): qty 1

## 2020-12-25 MED ORDER — NALBUPHINE HCL 10 MG/ML IJ SOLN
5.0000 mg | Freq: Once | INTRAMUSCULAR | Status: DC | PRN
Start: 2020-12-25 — End: 2020-12-28

## 2020-12-25 MED ORDER — SCOPOLAMINE 1 MG/3DAYS TD PT72
1.0000 | MEDICATED_PATCH | TRANSDERMAL | Status: DC
  Administered 2020-12-25: 1.5 mg via TRANSDERMAL

## 2020-12-25 MED ORDER — OXYTOCIN-SODIUM CHLORIDE 30-0.9 UT/500ML-% IV SOLN
INTRAVENOUS | Status: AC
  Filled 2020-12-25: qty 500

## 2020-12-25 MED ORDER — POVIDONE-IODINE 10 % EX SWAB: 2.0000 "application " | Freq: Once | CUTANEOUS | Status: DC

## 2020-12-25 MED ORDER — DEXAMETHASONE SODIUM PHOSPHATE 4 MG/ML IJ SOLN
INTRAMUSCULAR | Status: AC
  Filled 2020-12-25: qty 2

## 2020-12-25 MED ORDER — PHENYLEPHRINE HCL-NACL 20-0.9 MG/250ML-% IV SOLN
INTRAVENOUS | Status: AC
  Filled 2020-12-25: qty 250

## 2020-12-25 MED ORDER — PHENYLEPHRINE HCL-NACL 20-0.9 MG/250ML-% IV SOLN
INTRAVENOUS | Status: DC | PRN
  Administered 2020-12-25: 60 ug/min via INTRAVENOUS

## 2020-12-25 MED ORDER — NALOXONE HCL 4 MG/10ML IJ SOLN
1.0000 ug/kg/h | INTRAVENOUS | Status: DC | PRN
  Filled 2020-12-25: qty 5

## 2020-12-25 MED ORDER — FENTANYL CITRATE (PF) 100 MCG/2ML IJ SOLN: 25.0000 ug | INTRAMUSCULAR | Status: DC | PRN

## 2020-12-25 MED ORDER — MORPHINE SULFATE (PF) 0.5 MG/ML IJ SOLN
INTRAMUSCULAR | Status: DC | PRN
  Administered 2020-12-25: .15 mg via INTRATHECAL

## 2020-12-25 MED ORDER — MENTHOL 3 MG MT LOZG: 1.0000 | LOZENGE | OROMUCOSAL | Status: DC | PRN

## 2020-12-25 MED ORDER — PHENYLEPHRINE HCL (PRESSORS) 10 MG/ML IV SOLN
INTRAVENOUS | Status: DC | PRN
  Administered 2020-12-25 (×5): 80 ug via INTRAVENOUS

## 2020-12-25 SURGICAL SUPPLY — 34 items
BENZOIN TINCTURE PRP APPL 2/3 (GAUZE/BANDAGES/DRESSINGS) ×2 IMPLANT
CHLORAPREP W/TINT 26ML (MISCELLANEOUS) ×2 IMPLANT
CLAMP CORD UMBIL (MISCELLANEOUS) IMPLANT
CLOTH BEACON ORANGE TIMEOUT ST (SAFETY) ×2 IMPLANT
CLSR STERI-STRIP ANTIMIC 1/2X4 (GAUZE/BANDAGES/DRESSINGS) ×2 IMPLANT
DRSG OPSITE POSTOP 4X10 (GAUZE/BANDAGES/DRESSINGS) ×2 IMPLANT
ELECT REM PT RETURN 9FT ADLT (ELECTROSURGICAL) ×2
ELECTRODE REM PT RTRN 9FT ADLT (ELECTROSURGICAL) ×1 IMPLANT
EXTRACTOR VACUUM M CUP 4 TUBE (SUCTIONS) IMPLANT
GLOVE BIO SURGEON STRL SZ7.5 (GLOVE) ×2 IMPLANT
GLOVE BIOGEL PI IND STRL 7.0 (GLOVE) ×1 IMPLANT
GLOVE BIOGEL PI IND STRL 7.5 (GLOVE) ×1 IMPLANT
GLOVE BIOGEL PI INDICATOR 7.0 (GLOVE) ×1
GLOVE BIOGEL PI INDICATOR 7.5 (GLOVE) ×1
GOWN STRL REUS W/TWL LRG LVL3 (GOWN DISPOSABLE) ×4 IMPLANT
KIT ABG SYR 3ML LUER SLIP (SYRINGE) IMPLANT
NEEDLE HYPO 25X5/8 SAFETYGLIDE (NEEDLE) IMPLANT
NS IRRIG 1000ML POUR BTL (IV SOLUTION) ×2 IMPLANT
PACK C SECTION WH (CUSTOM PROCEDURE TRAY) ×2 IMPLANT
PAD OB MATERNITY 4.3X12.25 (PERSONAL CARE ITEMS) ×2 IMPLANT
PENCIL SMOKE EVAC W/HOLSTER (ELECTROSURGICAL) ×2 IMPLANT
RTRCTR C-SECT PINK 25CM LRG (MISCELLANEOUS) ×2 IMPLANT
STRIP CLOSURE SKIN 1/2X4 (GAUZE/BANDAGES/DRESSINGS) ×2 IMPLANT
SUT CHROMIC 2 0 CT 1 (SUTURE) ×4 IMPLANT
SUT MNCRL AB 3-0 PS2 27 (SUTURE) ×2 IMPLANT
SUT PLAIN 2 0 XLH (SUTURE) ×2 IMPLANT
SUT VIC AB 0 CT1 36 (SUTURE) ×2 IMPLANT
SUT VIC AB 0 CTX 36 (SUTURE) ×3
SUT VIC AB 0 CTX36XBRD ANBCTRL (SUTURE) ×3 IMPLANT
SUT VIC AB 2-0 SH 27 (SUTURE)
SUT VIC AB 2-0 SH 27XBRD (SUTURE) IMPLANT
TOWEL OR 17X24 6PK STRL BLUE (TOWEL DISPOSABLE) ×2 IMPLANT
TRAY FOLEY W/BAG SLVR 14FR LF (SET/KITS/TRAYS/PACK) ×2 IMPLANT
WATER STERILE IRR 1000ML POUR (IV SOLUTION) ×2 IMPLANT

## 2020-12-25 NOTE — Op Note (Signed)
Cesarean Section Procedure Note  Indications: P1 at 45 2/7wks presenting for repeat c-section due to h/o c-section.  Pre-operative Diagnosis: 1.40 2/7wks 2.h/o Cesarean Section   Post-operative Diagnosis: 1.40 2/7wks 2.h/o Cesarean Section  Procedure: REPEAT LOW TRANSVERSE CESAREAN SECTION  Surgeon: Osborn Coho, MD    Assistants: Aldine Contes, CNM  Anesthesia: Regional  Anesthesiologist: Mal Amabile, MD   Procedure Details  The patient was taken to the operating room secondary to h/o c-section after the risks, benefits, complications, treatment options, and expected outcomes were discussed with the patient.  The patient concurred with the proposed plan, giving informed consent which was signed and witnessed. The patient was taken to Operating Room B, identified as Nyiah Pianka and the procedure verified as C-Section Delivery. A Time Out was held and the above information confirmed.  After induction of anesthesia by obtaining a spinal, the patient was prepped and draped in the usual sterile manner. A Pfannenstiel skin incision was made and carried down through the subcutaneous tissue to the underlying layer of fascia.  The fascia was incised bilaterally and extended transversely bilaterally with the Mayo scissors. Kocher clamps were placed on the inferior aspect of the fascial incision and the underlying rectus muscle was separated from the fascia. The same was done on the superior aspect of the fascial incision.  The peritoneum was identified, entered bluntly and extended manually.  An Alexis self-retaining retractor was placed.  The utero-vesical peritoneal reflection was incised transversely and the bladder flap was bluntly freed from the lower uterine segment. A low transverse uterine incision was made with the scalpel and extended bilaterally with the bandage scissors.  The infant was delivered in vertex position (LOT) without difficulty.  After the umbilical cord was  clamped and cut, the infant was handed to the awaiting pediatricians.  Cord blood was obtained for evaluation.  The placenta was removed intact and appeared to be within normal limits. The uterus was cleared of all clots and debris. The uterine incision was closed with running interlocking sutures of 0 Vicryl and a second imbricating layer was performed as well.   A figure of eight stitch placed at the right angle for hemostasis where there was small area of oozing.  Bilateral tubes and ovaries appeared to be within normal limits.  Good hemostasis was noted.  Copious irrigation was performed until clear.  The peritoneum was repaired with 2-0 chromic via a running suture.  The fascia was reapproximated with a running suture of 0 Vicryl.  The skin was reapproximated with a subcuticular suture of 3-0 monocryl.  Steristrips were applied.  Instrument, sponge, and needle counts were correct prior to abdominal closure and at the conclusion of the case.  The patient was awaiting transfer to the recovery room in good condition.  Findings: Live female infant with Apgars 8 at one minute and 9 at five minutes.  Normal appearing bilateral ovaries and fallopian tubes were noted.  Estimated Blood Loss:  94 ml         Drains: foley to gravity 200 cc         Total IV Fluids: 3000 ml         Specimens to Pathology: None         Complications:  None; patient tolerated the procedure well.         Disposition: PACU - hemodynamically stable.         Condition: stable  Attending Attestation: I performed the procedure.  I was present and scrubbed  and the assistant was required due to complexity of anatomy.

## 2020-12-25 NOTE — Anesthesia Postprocedure Evaluation (Signed)
Anesthesia Post Note  Patient: Lindsey Hines  Procedure(s) Performed: CESAREAN SECTION (N/A )     Patient location during evaluation: PACU Anesthesia Type: Spinal Level of consciousness: oriented and awake and alert Pain management: pain level controlled Vital Signs Assessment: post-procedure vital signs reviewed and stable Respiratory status: spontaneous breathing, respiratory function stable and nonlabored ventilation Cardiovascular status: blood pressure returned to baseline and stable Postop Assessment: no headache, no backache, no apparent nausea or vomiting, spinal receding and patient able to bend at knees Anesthetic complications: no   No complications documented.  Last Vitals:  Vitals:   12/25/20 1730 12/25/20 1745  BP: (!) 107/57 114/74  Pulse: 63 63  Resp: 15 11  Temp:    SpO2: 100% 100%    Last Pain:  Vitals:   12/25/20 1745  TempSrc:   PainSc: 0-No pain   Pain Goal:    LLE Motor Response: No movement due to regional block (12/25/20 1745) LLE Sensation: Tingling,Increased (12/25/20 1745) RLE Motor Response: No movement due to regional block (12/25/20 1745) RLE Sensation: Tingling,Increased (12/25/20 1745)     Epidural/Spinal Function Cutaneous sensation: Tingles (12/25/20 1745), Patient able to flex knees: No (12/25/20 1745), Patient able to lift hips off bed: No (12/25/20 1745), Back pain beyond tenderness at insertion site: No (12/25/20 1745), Progressively worsening motor and/or sensory loss: No (12/25/20 1745), Bowel and/or bladder incontinence post epidural: No (12/25/20 1745)  Karina Nofsinger A.

## 2020-12-25 NOTE — Lactation Note (Signed)
This note was copied from a baby's chart. Lactation Consultation Note  Patient Name: Lindsey Hines [id number] Date: 12/25/2020 Reason for consult: Initial assessment;Term Age:31 hours  Initial visit to 6 hours old infant of a P2 mother with breastfeeding experience. Infant is sleeping in father's arms upon arrival.  Talked to mother about hand expression, mother is experienced and LC just reviewed technique. Colostrum easily expressed. Mother states infant breastfeed for ~16 minutes prior to [facility] visit.   Discussed breastfeeding basics and newborn expectations.    Plan: 1-Skin to skin 2-Aim for a deep, comfortable latch 3-Breastfeeding on demand or 8-12 times in 24h period. 4-Keep infant awake during breastfeeding session: massaging breast, infant's hand/shoulder/feet 5-Monitor voids and stools as signs good intake.  6-Encouraged maternal rest, hydration and food intake.  7-Contact LC as needed for feeds/support/concerns/questions   All questions answered at this time. Provided Lactation services brochure and promoted [employer] booklet information.   Maternal Data Has patient been taught Hand Expression?: Yes Does the patient have breastfeeding experience prior to this delivery?: Yes How long did the patient breastfeed?: 16 months  Feeding Mother's Current Feeding Choice: Breast Milk  Interventions Interventions: Breast feeding basics reviewed;Skin to skin;Hand express;Breast massage;Expressed milk;Education  Discharge Pump: Personal WIC Program: Yes  Consult Status Consult Status: Follow-up Date: 12/26/20 Follow-up type: In-patient    Lindsey Hines 12/25/2020, 10:42 PM

## 2020-12-25 NOTE — Transfer of Care (Signed)
Immediate Anesthesia Transfer of Care Note  Patient: Lindsey Hines  Procedure(s) Performed: CESAREAN SECTION (N/A )  Patient Location: PACU  Anesthesia Type:Spinal  Level of Consciousness: awake, alert  and oriented  Airway & Oxygen Therapy: Patient Spontanous Breathing  Post-op Assessment: Report given to RN and Post -op Vital signs reviewed and stable  Post vital signs: Reviewed and stable  Last Vitals:  Vitals Value Taken Time  BP    Temp    Pulse 103 12/25/20 1644  Resp    SpO2 82 % 12/25/20 1644  Vitals shown include unvalidated device data.  Last Pain:  Vitals:   12/25/20 1353  TempSrc: Oral         Complications: No complications documented.

## 2020-12-25 NOTE — Anesthesia Procedure Notes (Signed)
Spinal  Patient location during procedure: OR Start time: 12/25/2020 3:20 PM End time: 12/25/2020 3:25 PM Reason for block: surgical anesthesia Staffing Performed: anesthesiologist  Anesthesiologist: Mal Amabile, MD Preanesthetic Checklist Completed: patient identified, IV checked, site marked, risks and benefits discussed, surgical consent, monitors and equipment checked, pre-op evaluation and timeout performed Spinal Block Patient position: sitting Prep: DuraPrep Patient monitoring: heart rate, cardiac monitor, continuous pulse ox and blood pressure Approach: midline Location: L3-4 Injection technique: single-shot Needle Needle type: Pencan  Needle gauge: 24 G Needle length: 9 cm Needle insertion depth: 6 cm Assessment Sensory level: T4 Events: CSF return Additional Notes Patient tolerated procedure well. Adequate sensory level.

## 2020-12-25 NOTE — H&P (Addendum)
OB ADMISSION/ HISTORY & PHYSICAL:  Admission Date: 12/25/2020  1:29 PM  Admit Diagnosis: Repeat cesarean section  Lindsey Hines is a 30 y.o. female G47P0010 [redacted]w[redacted]d presenting for R C/S. Endorses active FM, denies LOF and vaginal bleeding.  History of current pregnancy: G3P0010   Primary OB Provider: [facility] Patient entered care with [facility] at 10.4 wks.   EDC 12/23/20 by [facility] 03/18/20 and congruent w/ 10.5  wk U/S.   Anatomy scan:  27 wks, complete w/ anterior placenta.   Last evaluation: 12/24/20 @ 40.1  wks  Significant prenatal events:  Patient Active Problem List   Diagnosis Date Noted  . Encounter for maternal care for low transverse scar from repeat cesarean delivery 12/25/2020  . Post-dates pregnancy 12/08/2017    Prenatal Labs: ABO, Rh:  O Positive Antibody:  Negative Rubella:  Immune  RPR:   NR HBsAg:   Negative HIV:   Negative GTT: 90 GBS:   Negative GC/CHL: Negative/Negative Genetics: Low risk female, Horizon negative Tdap/influenza vaccines: UTD/UTD  OB History  Gravida Para Term Preterm AB Living  3       1    SAB IAB Ectopic Multiple Live Births  1            # Outcome Date GA Lbr Len/2nd Weight Sex Delivery Anes PTL Lv  3 Current           2 SAB           1 Gravida             Medical / Surgical History: Past medical history:  Past Medical History:  Diagnosis Date  . Medical history non-contributory     Past surgical history:  Past Surgical History:  Procedure Laterality Date  . CESAREAN SECTION N/A 12/10/2017   Procedure: CESAREAN SECTION;  Surgeon: Tilda Burrow, MD;  Location: [facility] BIRTHING SUITES;  Service: Obstetrics;  Laterality: N/A;  . NO PAST SURGERIES     Family History: History reviewed. No pertinent family history.  Social History:  reports that she has never smoked. She has never used smokeless tobacco. She reports that she does not drink alcohol and does not use drugs.  Allergies: Patient has no known allergies.   Current  Medications at time of admission:  Prior to Admission medications   Medication Sig Start Date End Date Taking? Authorizing Provider  acetaminophen (TYLENOL) 500 MG tablet Take 2 tablets (1,000 mg total) by mouth 2 (two) times daily as needed for headache. 12/21/20   Bernerd Limbo, CNM  cetirizine (ZYRTEC) 10 MG tablet Take 1 tablet (10 mg total) by mouth daily. 12/21/20   Bernerd Limbo, CNM  guaiFENesin (MUCINEX) 600 MG 12 hr tablet Take 1 tablet (600 mg total) by mouth 2 (two) times daily as needed. 12/21/20   Bernerd Limbo, CNM  metoCLOPramide (REGLAN) 10 MG tablet Take 10 mg by mouth every 6 (six) hours as needed. 07/09/20   [provider]  pantoprazole (PROTONIX) 40 MG tablet Take 1 tablet by mouth daily. 07/09/20   [provider]  Prenatal Vit-Fe Fumarate-FA (WESTAB PLUS) 27-1 MG TABS Take 1 tablet by mouth daily. 08/26/20   [provider]  simethicone (MYLICON) 80 MG chewable tablet Chew 80 mg by mouth every 6 (six) hours as needed for flatulence.    [provider]    Review of Systems: Constitutional: Negative   HENT: Negative   Eyes: Negative   Respiratory: Negative   Cardiovascular: Negative  Gastrointestinal: Negative  Genitourinary: Negative for bloody show, Negative for LOF   Musculoskeletal: Negative   Skin: Negative   Neurological: Negative   Endo/Heme/Allergies: Negative   Psychiatric/Behavioral: Negative    Physical Exam: VS: unknown if currently breastfeeding. AAO x3, no signs of distress Cardiovascular: RRR Respiratory: Lung fields clear to ausculation GU/GI: Abdomen gravid, non-tender, non-distended, active FM, vertex Extremities: Negative edema, negative for pain, tenderness, and cords  Cervical exam: deferred  FHR doppler:145bpm   Prenatal Transfer Tool  Maternal Diabetes: No Genetic Screening: Normal Maternal Ultrasounds/Referrals: Normal Fetal Ultrasounds or other Referrals:  None Maternal Substance  Abuse:  No Significant Maternal Medications:  None Significant Maternal Lab Results: Group B Strep negative    Assessment: 30 y.o. G3P0010 [redacted]w[redacted]d admitted for repeat cesarean section.  GBS Negative Pain management plan: Spinal per anesthesia    Plan:  Admit to L&D Routine c/section admission orders Dr Su Hilt notified of admission and plan of care  Carollee Leitz MSN, CNM 12/25/2020 1:48 PM   R/b/a reviewed with the patient including but not limited to bleeding infection and injury  Questions answered and consent signed and witnessed.  Migraine HA 6/10 today and 20/10 yesterday.  FHTs 145.

## 2020-12-25 NOTE — Anesthesia Preprocedure Evaluation (Addendum)
Anesthesia Evaluation  Patient identified by MRN, date of birth, ID band Patient awake    Reviewed: Allergy & Precautions, NPO status , Patient's Chart, lab work & pertinent test results  Airway Mallampati: II  TM Distance: >3 FB Neck ROM: Full    Dental no notable dental hx. (+) Teeth Intact   Pulmonary Recent URI , Residual Cough,  Cough x 2 weeks, Covid test pending    Pulmonary exam normal breath sounds clear to auscultation       Cardiovascular negative cardio ROS Normal cardiovascular exam Rhythm:Regular Rate:Normal     Neuro/Psych negative neurological ROS  negative psych ROS   GI/Hepatic Neg liver ROS, GERD  Controlled and Medicated,  Endo/Other  Obesity  Renal/GU negative Renal ROS  negative genitourinary   Musculoskeletal negative musculoskeletal ROS (+)   Abdominal (+) + obese,   Peds  Hematology negative hematology ROS (+)   Anesthesia Other Findings   Reproductive/Obstetrics (+) Pregnancy Previous C/S x 1                           Anesthesia Physical Anesthesia Plan  ASA: II  Anesthesia Plan: Spinal   Post-op Pain Management:    Induction:   PONV Risk Score and Plan: 4 or greater and Treatment may vary due to age or medical condition and Scopolamine patch - Pre-op  Airway Management Planned: Natural Airway  Additional Equipment:   Intra-op Plan:   Post-operative Plan:   Informed Consent: I have reviewed the patients History and Physical, chart, labs and discussed the procedure including the risks, benefits and alternatives for the proposed anesthesia with the patient or authorized representative who has indicated his/her understanding and acceptance.     Dental advisory given  Plan Discussed with: CRNA and Anesthesiologist  Anesthesia Plan Comments:        Anesthesia Quick Evaluation

## 2020-12-26 LAB — CBC
HCT: 32.6 % — ABNORMAL LOW (ref 36.0–46.0)
Hemoglobin: 11 g/dL — ABNORMAL LOW (ref 12.0–15.0)
MCH: 31 pg (ref 26.0–34.0)
MCHC: 33.7 g/dL (ref 30.0–36.0)
MCV: 91.8 fL (ref 80.0–100.0)
Platelets: 264 10*3/uL (ref 150–400)
RBC: 3.55 MIL/uL — ABNORMAL LOW (ref 3.87–5.11)
RDW: 13.1 % (ref 11.5–15.5)
WBC: 8.4 10*3/uL (ref 4.0–10.5)
nRBC: 0 % (ref 0.0–0.2)

## 2020-12-26 LAB — RPR: RPR Ser Ql: NONREACTIVE

## 2020-12-26 MED ORDER — GUAIFENESIN 100 MG/5ML PO SOLN
5.0000 mL | ORAL | Status: DC | PRN
  Administered 2020-12-26 – 2020-12-28 (×8): 100 mg via ORAL
  Filled 2020-12-26 (×8): qty 15

## 2020-12-26 MED ORDER — LACTATED RINGERS IV BOLUS
500.0000 mL | Freq: Once | INTRAVENOUS | Status: AC
  Administered 2020-12-26: 500 mL via INTRAVENOUS

## 2020-12-26 NOTE — Lactation Note (Signed)
This note was copied from a baby's chart. Lactation Consultation Note  Patient Name: Boy Rhona Fusilier RUEAV'W Date: 12/26/2020 Reason for consult: Follow-up assessment;Mother's request;Difficult latch;Term Age:32 hours   Infant went for circ and sleeping since his return. Mom offering formula and attempts at latching.  On arrival infant in and outfit. LC placed infant STS and latched on the left breast with tea cup hold and breast compression for 6 minutes. Mom sore nipples on the right side due to shallow latch with previous feeding. LC used 20 NS but infant frustrated would not latch.  Dad offer formula with slow flow nipple 16 ml.  LC talked with RN, Royden Purl, to work with Mom on next feeding to see if 20 or 24 NS better fit on the right breast.  Comfort gels provided for nipple pain. Mom aware to use when she is not sleeping, pumping or nursing. Do not use with coconut oil. RN to provide coconut oil for nipple care.    Plan 1. To feed based on cues 8-12x in 24 hr period no more than 4 hrs without an attempt. Mom to offer both breasts and call for RN assistance to latch on right breast.            2. Dad to paced bottle feed EBM first, then formula.  Breastfeeding supplementation guide reviewed based on hrs of age after birth. Dad to offer more if infant still hungry.             3. Mom to pump DEBP q 3 hrs for 15 minutes   All questions answered.  Plan discussed with RN, Royden Purl.   Maternal Data Has patient been taught Hand Expression?: Yes  Feeding Mother's Current Feeding Choice: Breast Milk and Formula  LATCH Score Latch: Repeated attempts needed to sustain latch, nipple held in mouth throughout feeding, stimulation needed to elicit sucking reflex.  Audible Swallowing: Spontaneous and intermittent  Type of Nipple: Everted at rest and after stimulation  Comfort (Breast/Nipple): Filling, red/small blisters or bruises, mild/mod discomfort  Hold (Positioning):  Assistance needed to correctly position infant at breast and maintain latch.  LATCH Score: 7   Lactation Tools Discussed/Used Tools: Pump;Flanges Flange Size: 27 Breast pump type: Double-Electric Breast Pump Pump Education: Setup, frequency, and cleaning;Milk Storage Reason for Pumping: increase stimulation Pumping frequency: every 3 hrs for 15 minutes  Interventions Interventions: Breast feeding basics reviewed;Breast compression;Assisted with latch;Adjust position;Skin to skin;Support pillows;Breast massage;Position options;Hand express;Expressed milk;Education;Comfort gels;DEBP  Discharge    Consult Status Consult Status: Follow-up Date: 12/27/20 Follow-up type: In-patient    Ardell Makarewicz  Nicholson-Springer 12/26/2020, 9:25 PM

## 2020-12-26 NOTE — Progress Notes (Addendum)
Lindsey Hines [id number] Postpartum Postoperative Day # 1  [address], [redacted]w[redacted]d, S/P RCS LT Cesarean Section due to Elective. Pt admitted on 4/19 for RCS with DR Su Hilt. AROM at delivery. EBL was , hgb drop of 12.2-11. Pt been c/o cough with increased phlem for x2 weeks, covid negative, denies HA, body aches, fever or myalsia. Did endorse having those s/sx 2 weeks ago.    Patient Active Problem List   Diagnosis Date Noted  . Cesarean delivery delivered 12/26/2020  . Normal postpartum course 12/26/2020  . Encounter for maternal care for low transverse scar from repeat cesarean delivery 12/25/2020     Active Ambulatory Problems    Diagnosis Date Noted  . No Active Ambulatory Problems   Resolved Ambulatory Problems    Diagnosis Date Noted  . Post-dates pregnancy 12/08/2017   Past Medical History:  Diagnosis Date  . Medical history non-contributory    Subjective: Patient up ad lib, denies syncope or dizziness. Reports consuming regular diet without issues and denies N/V. Patient reports 0 bowel movement + passing flatus.  Denies issues with urination, foley in place still, plan to remove today and reports bleeding is "lighter."  Patient is breast and bottle feeding and reports going well.  Desires paraguard at 6 wpp for postpartum contraception.  Pain is being appropriately managed with use of po meds. Pt been c/o cough with increased phlem for x2 weeks, covid negative, denies HA, body aches, fever or myalsia. Did endorse having those s/sx 2 weeks ago.    Objective: Patient Vitals for the past 24 hrs:  BP Temp Temp src Pulse Resp SpO2 Height Weight  12/26/20 0615 99/73 98.2 F (36.8 C) Oral 80 18 -- -- --  12/26/20 0145 107/60 98.5 F (36.9 C) Oral -- 16 -- -- --  12/25/20 2120 (!) 119/54 98.2 F (36.8 C) Oral 62 18 -- -- --  12/25/20 2020 114/83 98.1 F (36.7 C) Oral 61 18 100 % -- --  12/25/20 1922 117/70 97.8 F (36.6 C) -- 61 20 99 % -- --   12/25/20 1820 96/84 98.1 F (36.7 C) -- 78 18 99 % -- --  12/25/20 1745 114/74 -- -- 63 11 100 % -- --  12/25/20 1730 (!) 107/57 -- -- 63 15 100 % -- --  12/25/20 1715 -- -- -- 84 18 100 % -- --  12/25/20 1700 113/82 -- -- 80 15 100 % -- --  12/25/20 1645 100/70 98 F (36.7 C) -- 83 20 100 % -- --  12/25/20 1353 (!) 127/92 98.9 F (37.2 C) Oral 82 16 100 % 5\' 5"  (1.651 m) 94 kg     Physical Exam:  General: alert, cooperative, appears stated age and no distress Mood/Affect: Happy Lungs: clear to auscultation, no wheezes, rales or rhonchi, symmetric air entry.  Heart: normal rate, regular rhythm, normal S1, S2, no murmurs, rubs, clicks or gallops. Breast: breasts appear normal, no suspicious masses, no skin or nipple changes or axillary nodes. Abdomen:  + bowel sounds, soft, non-tender Incision: healing well, no significant drainage, no dehiscence, no significant erythema, Honeycomb dressing  Uterine Fundus: firm, involution -1 Lochia: appropriate Skin: Warm, Dry. DVT Evaluation: No evidence of DVT seen on physical exam. Negative Homan's sign. No cords or calf tenderness. No significant calf/ankle edema.  Labs: Recent Labs    12/25/20 1338 12/26/20 0526  HGB 12.2 11.0*  HCT 36.8 32.6*  WBC 6.4 8.4    CBG (last 3)  No results  for input(s): GLUCAP in the last 72 hours.   I/O: I/O last 3 completed shifts: In: 2000 [I.V.:2000] Out: 739 [Urine:640; Blood:99]   Assessment Postpartum Postoperative Day # 1  [address], G3P1011, [redacted]w[redacted]d, S/P RCS LT Cesarean Section due to Elective. Pt admitted on 4/19 for RCS with DR Su Hilt. AROM at delivery. EBL was , hgb drop of 12.2-11.  Pt stable. -1 Involution. Breast and bottle Feeding. Baby Female for in pt circ today. Hemodynamically Stable. Pt been c/o cough with increased phlem for x2 weeks, covid negative, denies HA, body aches, fever or myalsia. Did endorse having those s/sx 2 weeks ago.    Plan: Continue other mgmt  as ordered Cough: Ordered robitussin PRN VTE Prophylactics: SCD, ambulated as tolerates.  Pain control: Motrin/Tylenol/Narcotics PRN Education given regarding options for contraception, including barrier methods, injectable contraception, IUD placement, oral contraceptives.  Breastfeeding, Lactation consult, Circumcision prior to discharge and Contraception paraguard at 6 weeks PP  Plan for D/C on POD# 2-3.   Dr. Sallye Ober to be updated on patient status  [facility] NP-C, CNM 12/26/2020, 9:22 AM

## 2020-12-27 NOTE — Lactation Note (Signed)
This note was copied from a baby's chart. Lactation Consultation Note  Patient Name: Lindsey Hines [id number] Date: 12/27/2020 Reason for consult: Follow-up assessment;Term Age:32 hours  P2 mother whose infant is now 67 hours old.  This is a term baby at 40+2 weeks.  Mother breast fed her first child (now three years old) for 16 months.  Mother has been breast feeding and bottle feeding with Similac 20.  Mother pumped yesterday and obtained 5 mls of colostrum.  Per mother, pumping was painful and she has stopped pumping.  Discussed the importance of pumping to help ensure a good milk supply, especially if she is only giving formula and not latching to the breast. Mother has not latched or pumped since approximately midnight last night.  Offered to assess the pump and pump settings for her.  Reviewed pump and pump set up.  Mother is using the #27 flange size and discussed settings.  Observed her pumping for approximately 10 minutes of the session.  Mother denied pain and had obtained approximately 40 mls when I exited the room.  Praised mother and assisted to develop a plan for today.  She will continue to latch followed by supplementing with her EBM and then formula (only if needed).  Mother will call for latch assistance if she is having any difficulty or soreness.  Reviewed milk storage times for breast milk and formula.  Mother has a DEBP for home use.  No support person present at this time.  RN updated.   Maternal Data Has patient been taught Hand Expression?: Yes Does the patient have breastfeeding experience prior to this delivery?: Yes How long did the patient breastfeed?: 16 months  Feeding Mother's Current Feeding Choice: Breast Milk and Formula Nipple Type: (P) Slow - flow  LATCH Score                    Lactation Tools Discussed/Used Flange Size: 27 Breast pump type: Double-Electric Breast Pump;Manual Pump Education: Setup, frequency, and cleaning;Milk Storage  (Reviewed due to mother complaining of breast soreness) Reason for Pumping: Breast stimulation Pumping frequency: After breast feeding every three to four hours Pumped volume: 40 mL (Mother not completely finished when I left the room)  Interventions    Discharge Pump: DEBP;Manual;Personal  Consult Status Consult Status: Follow-up Date: 12/28/20 Follow-up type: In-patient    Charlestine Rookstool R Rahman Ferrall 12/27/2020, 12:16 PM

## 2020-12-27 NOTE — Progress Notes (Signed)
Subjective: POD# 2 Repeat cesarean section Information for the patient's newborn:  Ikesha, Siller [[id number]]  female    Baby's Name [facility] Circumcision complete  Reports feeling nervous about going home. Wants to stay another day. Feeding: breast Reports tolerating PO and denies N/V, foley removed, ambulating and urinating w/o difficulty  Pain controlled with acetaminophen, ibuprofen (OTC) and narcotic analgesics including oxycodone Denies HA/SOB/dizziness  Flatus present Vaginal bleeding is normal, no clots     Objective:  VS:  Vitals:   12/26/20 0615 12/26/20 0950 12/26/20 2300 12/27/20 0549  BP: 99/73 108/68 125/77 122/82  Pulse: 80 64 82 74  Resp: 18 18 18 20   Temp: 98.2 F (36.8 C) 98.4 F (36.9 C) 98.2 F (36.8 C) 98 F (36.7 C)  TempSrc: Oral Oral Oral Oral  SpO2:      Weight:      Height:        Intake/Output Summary (Last 24 hours) at 12/27/2020 12/29/2020 Last data filed at 12/26/2020 1830 Gross per 24 hour  Intake --  Output 700 ml  Net -700 ml     Recent Labs    12/25/20 1338 12/26/20 0526  WBC 6.4 8.4  HGB 12.2 11.0*  HCT 36.8 32.6*  PLT 278 264    Blood type: --/--/O POS (04/19 1338) Rubella:      Physical Exam:  General: alert, cooperative and no distress CV: Regular rate and rhythm Resp: clear Abdomen: soft, nontender, normal bowel sounds Incision: clean, dry and intact Perineum:  Uterine Fundus: firm, below umbilicus, nontender Lochia: minimal Ext: extremities normal, atraumatic, no cyanosis or edema   Assessment/Plan: 30 y.o.   POD# 2. 34                  Active Problems:   Encounter for maternal care for low transverse scar from repeat cesarean delivery   Cesarean delivery delivered   Normal postpartum course   Routine post-op PP care          Advance diet as tolerated Advised warm fluids and ambulation to improve GI motility Encourage rest when baby rests Breastfeeding support Anticipate D/C 12/28/20 Dr 12/30/20  updated on pt status and POC  Su Hilt, MSN, CNM 12/27/2020, 9:28 AM

## 2020-12-28 MED ORDER — OXYCODONE HCL 5 MG PO TABS: 5.0000 mg | ORAL_TABLET | ORAL | 0 refills | Status: AC | PRN

## 2020-12-28 MED ORDER — IBUPROFEN 800 MG PO TABS: 800.0000 mg | ORAL_TABLET | Freq: Four times a day (QID) | ORAL | 0 refills | Status: AC

## 2020-12-28 MED ORDER — ACETAMINOPHEN 500 MG PO TABS: 1000.0000 mg | ORAL_TABLET | Freq: Four times a day (QID) | ORAL | 0 refills | Status: AC

## 2020-12-28 NOTE — Discharge Summary (Signed)
Postpartum Discharge Summary  Date of Service updated 12/29/20    Patient Name: Lindsey Hines DOB: 1991-01-14 MRN: [id number]  Date of admission: 12/25/2020 Delivery date:12/25/2020  Delivering provider: Everett Graff  Date of discharge: 12/28/2020  Admitting diagnosis: Encounter for maternal care for low transverse scar from repeat cesarean delivery [O34.211] Intrauterine pregnancy: [redacted]w[redacted]d    Secondary diagnosis:  Active Problems:   Encounter for maternal care for low transverse scar from repeat cesarean delivery   Cesarean delivery delivered   Normal postpartum course  Additional problems: none    Discharge diagnosis: Term Pregnancy Delivered                                              Post partum procedures:none Augmentation: N/A Complications: None  Hospital course: Scheduled C/S   30y.o. yo G3P1011 at 411w2das admitted to the hospital 12/25/2020 for scheduled cesarean section with the following indication:Elective Repeat.Delivery details are as follows:  Membrane Rupture Time/Date: 3:53 PM ,12/25/2020   Delivery Method:C-Section, Low Transverse  Details of operation can be found in separate operative note.  Patient had an uncomplicated postpartum course.  She is ambulating, tolerating a regular diet, passing flatus, and urinating well. Patient is discharged home in stable condition on  12/28/20        Newborn Data: Birth date:12/25/2020  Birth time:3:53 PM  Gender:Female  Living status:Living  Apgars:8 ,9  Weight:3490 g     Magnesium Sulfate received: No BMZ received: No Rhophylac:N/A MMR:N/A T-DaP:Given prenatally Flu: Yes Transfusion:No  Physical exam  Vitals:   12/27/20 0549 12/27/20 1304 12/27/20 2201 12/28/20 0530  BP: 122/82 111/66 118/79 114/85  Pulse: 74 71 76 66  Resp: 20 19 18 16   Temp: 98 F (36.7 C) 98.4 F (36.9 C) 98.4 F (36.9 C) 98.4 F (36.9 C)  TempSrc: Oral Oral Oral Oral  SpO2:  100%  100%  Weight:      Height:       General:  alert, cooperative and no distress Lochia: appropriate Uterine Fundus: firm Incision: Healing well with no significant drainage, Dressing is clean, dry, and intact DVT Evaluation: No evidence of DVT seen on physical exam. No cords or calf tenderness. No significant calf/ankle edema. Labs: Lab Results  Component Value Date   WBC 8.4 12/26/2020   HGB 11.0 (L) 12/26/2020   HCT 32.6 (L) 12/26/2020   MCV 91.8 12/26/2020   PLT 264 12/26/2020   CMP Latest Ref Rng & Units 12/25/2020  Glucose 70 - 99 mg/dL 81  BUN 6 - 20 mg/dL <5(L)  Creatinine 0.44 - 1.00 mg/dL 0.61  Sodium 135 - 145 mmol/L 134(L)  Potassium 3.5 - 5.1 mmol/L 4.0  Chloride 98 - 111 mmol/L 104  CO2 22 - 32 mmol/L 22  Calcium 8.9 - 10.3 mg/dL 8.6(L)  Total Protein 6.5 - 8.1 g/dL -  Total Bilirubin 0.3 - 1.2 mg/dL -  Alkaline Phos 38 - 126 U/L -  AST 15 - 41 U/L -  ALT 0 - 44 U/L -   Edinburgh Score: Edinburgh Postnatal Depression Scale Screening Tool 12/11/2017  I have been able to laugh and see the funny side of things. 1  I have looked forward with enjoyment to things. 0  I have blamed myself unnecessarily when things went wrong. 0  I have been anxious or worried for no good reason.  0  I have felt scared or panicky for no good reason. 0  Things have been getting on top of me. 1  I have been so unhappy that I have had difficulty sleeping. 0  I have felt sad or miserable. 0  I have been so unhappy that I have been crying. 0  The thought of harming myself has occurred to me. 0  Edinburgh Postnatal Depression Scale Total 2      After visit meds:  Allergies as of 12/28/2020   No Known Allergies     Medication List    STOP taking these medications   metoCLOPramide 10 MG tablet Commonly known as: REGLAN     TAKE these medications   acetaminophen 500 MG tablet Commonly known as: TYLENOL Take 2 tablets (1,000 mg total) by mouth every 6 (six) hours. What changed:   when to take this  reasons to take  this   cetirizine 10 MG tablet Commonly known as: ZYRTEC Take 1 tablet (10 mg total) by mouth daily.   guaiFENesin 600 MG 12 hr tablet Commonly known as: Mucinex Take 1 tablet (600 mg total) by mouth 2 (two) times daily as needed.   ibuprofen 800 MG tablet Commonly known as: ADVIL Take 1 tablet (800 mg total) by mouth every 6 (six) hours.   oxyCODONE 5 MG immediate release tablet Commonly known as: Oxy IR/ROXICODONE Take 1 tablet (5 mg total) by mouth every 4 (four) hours as needed for up to 3 days for moderate pain.   pantoprazole 40 MG tablet Commonly known as: PROTONIX Take 1 tablet by mouth daily.   simethicone 80 MG chewable tablet Commonly known as: MYLICON Chew 80 mg by mouth every 6 (six) hours as needed for flatulence.   WesTab Plus 27-1 MG Tabs Take 1 tablet by mouth daily.        Discharge home in stable condition Infant Feeding: Breast Infant Disposition:home with mother Discharge instruction: per After Visit Summary and Postpartum booklet. Activity: Advance as tolerated. Pelvic rest for 6 weeks.  Diet: routine diet Anticipated Birth Control: Unsure Postpartum Appointment:6 weeks Additional Postpartum F/U: none Future Appointments:No future appointments. Follow up Visit:  Follow-up Information    Everett Graff, MD. Schedule an appointment as soon as possible for a visit in 6 week(s).   Specialty: Obstetrics and Gynecology Contact information: [address] [place] [address] [phone]                   12/28/2020 Arrie Eastern, CNM

## 2020-12-28 NOTE — Lactation Note (Signed)
This note was copied from a baby's chart. Lactation Consultation Note  Patient Name: Lindsey Hines [id number] Date: 12/28/2020 Reason for consult: Follow-up assessment;Term Age:32 hours   P2 mother whose infant is now 57 hours old.  This is a term baby at 40+2 weeks.  Mother breast fed her first child (now 30 years old) for 16 months.  Mother has been breast feeding and supplementing with formula and her own EBM.  Mother had no questions/concerns related to breast feeding. Last LATCH score was a 7.  Baby is voiding/stooling well.  Mother has been pumping and obtaining approximately 30+ mls of EBM.  She is feeding her EBM first prior to giving any formula supplementation.  Mother has a DEBP for home use.  Support person present and asleep on the couch.     Maternal Data Has patient been taught Hand Expression?: Yes Does the patient have breastfeeding experience prior to this delivery?: Yes How long did the patient breastfeed?: 16 months  Feeding Mother's Current Feeding Choice: Breast Milk and Formula  LATCH Score                    Lactation Tools Discussed/Used Flange Size: 27 Breast pump type: Double-Electric Breast Pump;Manual  Interventions Interventions: Education  Discharge Discharge Education: Engorgement and breast care Pump: DEBP;Manual  Consult Status Consult Status: Complete Date: 12/28/20 Follow-up type: Call as needed    Camar Guyton R Ommie Degeorge 12/28/2020, 8:28 AM

## 2021-03-04 ENCOUNTER — Other Ambulatory Visit: Payer: Self-pay | Admitting: Obstetrics & Gynecology

## 2021-03-04 DIAGNOSIS — R109 Unspecified abdominal pain: Secondary | ICD-10-CM

## 2021-03-08 ENCOUNTER — Other Ambulatory Visit: Payer: Self-pay

## 2021-03-08 ENCOUNTER — Ambulatory Visit
Admission: RE | Admit: 2021-03-08 | Discharge: 2021-03-08 | Disposition: A | Discharge status: Home / Self Care | Payer: BLUE CROSS/BLUE SHIELD | Source: Ambulatory Visit | Attending: Obstetrics & Gynecology | Admitting: Obstetrics & Gynecology

## 2021-03-08 DIAGNOSIS — R109 Unspecified abdominal pain: Secondary | ICD-10-CM

## 2021-03-08 MED ORDER — IOPAMIDOL (ISOVUE-M 300) INJECTION 61%: 15.0000 mL | Freq: Once | INTRAMUSCULAR | Status: DC | PRN

## 2021-03-08 MED ORDER — IOPAMIDOL (ISOVUE-300) INJECTION 61%
100.0000 mL | Freq: Once | INTRAVENOUS | Status: AC | PRN
  Administered 2021-03-08: 100 mL via INTRAVENOUS

## 2021-05-04 ENCOUNTER — Emergency Department (HOSPITAL_COMMUNITY): Payer: BLUE CROSS/BLUE SHIELD

## 2021-05-04 ENCOUNTER — Other Ambulatory Visit: Payer: Self-pay

## 2021-05-04 ENCOUNTER — Encounter (HOSPITAL_COMMUNITY): Payer: Self-pay | Admitting: Emergency Medicine

## 2021-05-04 ENCOUNTER — Emergency Department (HOSPITAL_COMMUNITY)
Admission: EM | Admit: 2021-05-04 | Discharge: 2021-05-04 | Disposition: A | Discharge status: Home / Self Care | Payer: BLUE CROSS/BLUE SHIELD | Attending: Emergency Medicine | Admitting: Emergency Medicine

## 2021-05-04 DIAGNOSIS — R519 Headache, unspecified: Secondary | ICD-10-CM | POA: Diagnosis not present

## 2021-05-04 DIAGNOSIS — R509 Fever, unspecified: Secondary | ICD-10-CM | POA: Diagnosis not present

## 2021-05-04 DIAGNOSIS — Z20822 Contact with and (suspected) exposure to covid-19: Secondary | ICD-10-CM | POA: Insufficient documentation

## 2021-05-04 DIAGNOSIS — M791 Myalgia, unspecified site: Secondary | ICD-10-CM | POA: Insufficient documentation

## 2021-05-04 DIAGNOSIS — N898 Other specified noninflammatory disorders of vagina: Secondary | ICD-10-CM | POA: Diagnosis not present

## 2021-05-04 DIAGNOSIS — R000 Tachycardia, unspecified: Secondary | ICD-10-CM | POA: Insufficient documentation

## 2021-05-04 DIAGNOSIS — R1032 Left lower quadrant pain: Secondary | ICD-10-CM | POA: Insufficient documentation

## 2021-05-04 DIAGNOSIS — R110 Nausea: Secondary | ICD-10-CM | POA: Insufficient documentation

## 2021-05-04 HISTORY — DX: Sebaceous cyst: L72.3

## 2021-05-04 LAB — COMPREHENSIVE METABOLIC PANEL
ALT: 13 U/L (ref 0–44)
AST: 18 U/L (ref 15–41)
Albumin: 3.7 g/dL (ref 3.5–5.0)
Alkaline Phosphatase: 110 U/L (ref 38–126)
Anion gap: 10 (ref 5–15)
BUN: 8 mg/dL (ref 6–20)
CO2: 22 mmol/L (ref 22–32)
Calcium: 8.4 mg/dL — ABNORMAL LOW (ref 8.9–10.3)
Chloride: 102 mmol/L (ref 98–111)
Creatinine, Ser: 0.73 mg/dL (ref 0.44–1.00)
GFR, Estimated: >60 mL/min (ref >60)
Glucose, Bld: 104 mg/dL — ABNORMAL HIGH (ref 70–99)
Potassium: 3.7 mmol/L (ref 3.5–5.1)
Sodium: 134 mmol/L — ABNORMAL LOW (ref 135–145)
Total Bilirubin: 0.9 mg/dL (ref 0.3–1.2)
Total Protein: 7.3 g/dL (ref 6.5–8.1)

## 2021-05-04 LAB — CBC WITH DIFFERENTIAL/PLATELET
Abs Immature Granulocytes: 0.07 10*3/uL (ref 0.00–0.07)
Basophils Absolute: 0 10*3/uL (ref 0.0–0.1)
Basophils Relative: 0 %
Eosinophils Absolute: 0 10*3/uL (ref 0.0–0.5)
Eosinophils Relative: 0 %
HCT: 39.8 % (ref 36.0–46.0)
Hemoglobin: 13 g/dL (ref 12.0–15.0)
Immature Granulocytes: 1 %
Lymphocytes Relative: 8 %
Lymphs Abs: 0.9 10*3/uL (ref 0.7–4.0)
MCH: 30.3 pg (ref 26.0–34.0)
MCHC: 32.7 g/dL (ref 30.0–36.0)
MCV: 92.8 fL (ref 80.0–100.0)
Monocytes Absolute: 0.5 10*3/uL (ref 0.1–1.0)
Monocytes Relative: 4 %
Neutro Abs: 10.2 10*3/uL — ABNORMAL HIGH (ref 1.7–7.7)
Neutrophils Relative %: 87 %
Platelets: 251 10*3/uL (ref 150–400)
RBC: 4.29 MIL/uL (ref 3.87–5.11)
RDW: 12.9 % (ref 11.5–15.5)
WBC: 11.7 10*3/uL — ABNORMAL HIGH (ref 4.0–10.5)
nRBC: 0 % (ref 0.0–0.2)

## 2021-05-04 LAB — LIPASE, BLOOD: Lipase: 20 U/L (ref 11–51)

## 2021-05-04 LAB — I-STAT BETA HCG BLOOD, ED (MC, WL, AP ONLY): I-stat hCG, quantitative: 7.8 m[IU]/mL — ABNORMAL HIGH (ref <5)

## 2021-05-04 LAB — URINALYSIS, ROUTINE W REFLEX MICROSCOPIC
Bilirubin Urine: NEGATIVE
Glucose, UA: NEGATIVE mg/dL
Hgb urine dipstick: NEGATIVE
Ketones, ur: 80 mg/dL — AB
Leukocytes,Ua: NEGATIVE
Nitrite: NEGATIVE
Protein, ur: NEGATIVE mg/dL
Specific Gravity, Urine: 1.019 (ref 1.005–1.030)
pH: 6 (ref 5.0–8.0)

## 2021-05-04 LAB — PREGNANCY, URINE: Preg Test, Ur: NEGATIVE

## 2021-05-04 LAB — RESP PANEL BY RT-PCR (FLU A&B, COVID) ARPGX2
Influenza A by PCR: NEGATIVE
Influenza B by PCR: NEGATIVE
SARS Coronavirus 2 by RT PCR: NEGATIVE

## 2021-05-04 LAB — LACTIC ACID, PLASMA: Lactic Acid, Venous: 1.1 mmol/L (ref 0.5–1.9)

## 2021-05-04 MED ORDER — ACETAMINOPHEN 325 MG PO TABS
650.0000 mg | ORAL_TABLET | Freq: Once | ORAL | Status: AC
  Administered 2021-05-04: 650 mg via ORAL
  Filled 2021-05-04: qty 2

## 2021-05-04 MED ORDER — IOHEXOL 350 MG/ML SOLN
75.0000 mL | Freq: Once | INTRAVENOUS | Status: AC | PRN
  Administered 2021-05-04: 75 mL via INTRAVENOUS

## 2021-05-04 MED ORDER — SODIUM CHLORIDE 0.9 % IV BOLUS
1000.0000 mL | Freq: Once | INTRAVENOUS | Status: AC
  Administered 2021-05-04: 1000 mL via INTRAVENOUS

## 2021-05-04 NOTE — ED Triage Notes (Signed)
Patient coming from home, complaint of abdominal pain and muscle pain.

## 2021-05-04 NOTE — ED Provider Notes (Signed)
[facility] EMERGENCY DEPARTMENT Provider Note   CSN: [id number] Arrival date & time: 05/04/21  1540     History Chief Complaint  Patient presents with   Abdominal Pain   Muscle Pain    Lindsey Hines is a 30 y.o. female.  She has no significant medical history.  She is complaining of headache body aches joint pain nausea that started yesterday.  She is also had some left breast pain when she was breast-feeding.  She also has a little bit of left lower quadrant pain.  No cough or shortness of breath no vomiting diarrhea.  No urinary symptoms.  She thinks she may have a little bit of vaginal discharge.  She went to urgent care where she had a negative urine and COVID swab.  She is COVID vaccinated but not boosted.  On arrival here she had a temperature up to 103.  She was given Tylenol.  The history is provided by the patient.  Abdominal Pain Pain location:  LLQ Pain quality: aching   Pain severity:  Moderate Onset quality:  Gradual Duration:  2 days Timing:  Intermittent Progression:  Unchanged Chronicity:  Recurrent Associated symptoms: chills, fever, nausea and vaginal discharge   Associated symptoms: no chest pain, no cough, no diarrhea, no hematuria, no sore throat and no vomiting   Fever:    Max temp PTA:  103   Temp source:  Oral   Progression:  Unchanged     Past Medical History:  Diagnosis Date   Medical history non-contributory    Sebaceous cyst     Patient Active Problem List   Diagnosis Date Noted   Cesarean delivery delivered 12/26/2020   Normal postpartum course 12/26/2020   Encounter for maternal care for low transverse scar from repeat cesarean delivery 12/25/2020    Past Surgical History:  Procedure Laterality Date   CESAREAN SECTION N/A 12/10/2017   Procedure: CESAREAN SECTION;  Surgeon: Tilda Burrow, MD;  Location: [facility] BIRTHING SUITES;  Service: Obstetrics;  Laterality: N/A;   CESAREAN SECTION N/A 12/25/2020   Procedure:  CESAREAN SECTION;  Surgeon: Osborn Coho, MD;  Location: [facility];  Service: Obstetrics;  Laterality: N/A;   NO PAST SURGERIES       OB History     Gravida  3   Para  1   Term  1   Preterm      AB  1   Living  1      SAB  1   IAB      Ectopic      Multiple  0   Live Births  1           History reviewed. No pertinent family history.  Social History   Tobacco Use   Smoking status: Never   Smokeless tobacco: Never  Substance Use Topics   Alcohol use: No   Drug use: No    Home Medications Prior to Admission medications   Medication Sig Start Date End Date Taking? Authorizing Provider  acetaminophen (TYLENOL) 500 MG tablet Take 2 tablets (1,000 mg total) by mouth every 6 (six) hours. 12/28/20   Roma Schanz, CNM  cetirizine (ZYRTEC) 10 MG tablet Take 1 tablet (10 mg total) by mouth daily. 12/21/20   Bernerd Limbo, CNM  guaiFENesin (MUCINEX) 600 MG 12 hr tablet Take 1 tablet (600 mg total) by mouth 2 (two) times daily as needed. 12/21/20   Bernerd Limbo, CNM  ibuprofen (ADVIL) 800  MG tablet Take 1 tablet (800 mg total) by mouth every 6 (six) hours. 12/28/20   Roma Schanz, CNM  pantoprazole (PROTONIX) 40 MG tablet Take 1 tablet by mouth daily. 07/09/20   [provider]  Prenatal Vit-Fe Fumarate-FA (WESTAB PLUS) 27-1 MG TABS Take 1 tablet by mouth daily. 08/26/20   [provider]  simethicone (MYLICON) 80 MG chewable tablet Chew 80 mg by mouth every 6 (six) hours as needed for flatulence.    [provider]    Allergies    Patient has no known allergies.  Review of Systems   Review of Systems  Constitutional:  Positive for chills and fever.  HENT:  Negative for sore throat.   Eyes:  Negative for visual disturbance.  Respiratory:  Negative for cough.   Cardiovascular:  Negative for chest pain.  Gastrointestinal:  Positive for abdominal pain and nausea. Negative for diarrhea and vomiting.  Genitourinary:  Positive  for vaginal discharge. Negative for hematuria.  Musculoskeletal:  Positive for arthralgias, back pain and myalgias.  Skin:  Negative for rash.  Neurological:  Positive for headaches.   Physical Exam Updated Vital Signs BP 107/64 (BP Location: Left Arm)   Pulse (!) 114   Temp (!) 100.4 F (38 C) (Oral)   Resp 20   SpO2 100%   Physical Exam Vitals and nursing note reviewed.  Constitutional:      General: She is not in acute distress.    Appearance: Normal appearance. She is well-developed.  HENT:     Head: Normocephalic and atraumatic.  Eyes:     Conjunctiva/sclera: Conjunctivae normal.  Cardiovascular:     Rate and Rhythm: Regular rhythm. Tachycardia present.     Heart sounds: No murmur heard. Pulmonary:     Effort: Pulmonary effort is normal. No respiratory distress.     Breath sounds: Normal breath sounds.  Chest:     Comments: Left breast exam done with ED tech as chaperone.  There are no masses appreciated no overlying erythema or nipple discharge. Abdominal:     Palpations: Abdomen is soft.     Tenderness: There is no abdominal tenderness. There is no guarding or rebound.  Musculoskeletal:     Cervical back: Neck supple.  Skin:    General: Skin is warm and dry.  Neurological:     General: No focal deficit present.     Mental Status: She is alert.     Gait: Gait normal.    ED Results / Procedures / Treatments   Labs (all labs ordered are listed, but only abnormal results are displayed) Labs Reviewed  CBC WITH DIFFERENTIAL/PLATELET - Abnormal; Notable for the following components:      Result Value   WBC 11.7 (*)    Neutro Abs 10.2 (*)    All other components within normal limits  COMPREHENSIVE METABOLIC PANEL - Abnormal; Notable for the following components:   Sodium 134 (*)    Glucose, Bld 104 (*)    Calcium 8.4 (*)    All other components within normal limits  URINALYSIS, ROUTINE W REFLEX MICROSCOPIC - Abnormal; Notable for the following components:    APPearance HAZY (*)    Ketones, ur 80 (*)    All other components within normal limits  I-STAT BETA HCG BLOOD, ED ([facility], [facility], [facility] ONLY) - Abnormal; Notable for the following components:   I-stat hCG, quantitative 7.8 (*)    All other components within normal limits  RESP PANEL BY RT-PCR (FLU A&B, COVID)  ARPGX2  CULTURE, BLOOD (ROUTINE X 2)  CULTURE, BLOOD (ROUTINE X 2)  LIPASE, BLOOD  LACTIC ACID, PLASMA  PREGNANCY, URINE    EKG EKG Interpretation  Date/Time:  Saturday May 04 2021 18:37:54 EDT Ventricular Rate:  101 PR Interval:  134 QRS Duration: 97 QT Interval:  327 QTC Calculation: 424 R Axis:   71 Text Interpretation: Sinus tachycardia No old tracing to compare Confirmed by Meridee ScoreButler, Henreitta Spittler [phone]([id number]) on 05/04/2021 7:06:35 PM  Radiology DG Chest 2 View  Result Date: 05/04/2021 CLINICAL DATA:  Fever, shortness of breath, weakness. EXAM: CHEST - 2 VIEW COMPARISON:  None. FINDINGS: The heart size and mediastinal contours are within normal limits. Both lungs are clear. The visualized skeletal structures are unremarkable. IMPRESSION: No active cardiopulmonary disease.  No evidence of pneumonia. Electronically Signed   By: Bary RichardStan  Maynard M.D.   On: 05/04/2021 17:20   CT Abdomen Pelvis W Contrast  Result Date: 05/04/2021 CLINICAL DATA:  Abdominal pain. EXAM: CT ABDOMEN AND PELVIS WITH CONTRAST TECHNIQUE: Multidetector CT imaging of the abdomen and pelvis was performed using the standard protocol following bolus administration of intravenous contrast. CONTRAST:  75mL OMNIPAQUE IOHEXOL 350 MG/ML SOLN COMPARISON:  March 08, 2021 FINDINGS: Lower chest: No acute abnormality. Hepatobiliary: No focal liver abnormality is seen. No gallstones, gallbladder wall thickening, or biliary dilatation. Pancreas: Unremarkable. No pancreatic ductal dilatation or surrounding inflammatory changes. Spleen: Normal in size without focal abnormality. Adrenals/Urinary Tract: Adrenal glands are unremarkable. Kidneys are  normal, without renal calculi, focal lesion, or hydronephrosis. Bladder is unremarkable. Stomach/Bowel: Stomach is within normal limits. The appendix is not clearly identified. Stool is seen throughout the large bowel. No evidence of bowel wall thickening, distention, or inflammatory changes. Vascular/Lymphatic: No significant vascular findings are present. No enlarged abdominal or pelvic lymph nodes. Reproductive: An IUD is in place. The uterus and bilateral adnexa are otherwise unremarkable. Other: A 1.6 cm x 1.1 cm fat containing umbilical hernia is seen. No abdominopelvic ascites. Musculoskeletal: No acute or significant osseous findings. IMPRESSION: 1. No acute or active process within the abdomen or pelvis. 2. Small fat containing umbilical hernia. Electronically Signed   By: Aram Candelahaddeus  Houston M.D.   On: 05/04/2021 22:24    Procedures Procedures   Medications Ordered in ED Medications  acetaminophen (TYLENOL) tablet 650 mg (650 mg Oral Given 05/04/21 1642)  sodium chloride 0.9 % bolus 1,000 mL (0 mLs Intravenous Stopped 05/04/21 2113)  iohexol (OMNIPAQUE) 350 MG/ML injection 75 mL (75 mLs Intravenous Contrast Given 05/04/21 2218)  sodium chloride 0.9 % bolus 1,000 mL (0 mLs Intravenous Stopped 05/04/21 2321)  acetaminophen (TYLENOL) tablet 650 mg (650 mg Oral Given 05/04/21 2253)    ED Course  I have reviewed the triage vital signs and the nursing notes.  Pertinent labs & imaging results that were available during my care of the patient were reviewed by me and considered in my medical decision making (see chart for details).  Clinical Course as of 05/05/21 1038  Sat May 04, 2021  2243 No recent travel.  No sick contacts [MB]    Clinical Course User Index [MB] Terrilee FilesButler, Caralee Morea C, MD   MDM Rules/Calculators/A&P                          This patient complains of headache body aches fever abdominal pain; this involves an extensive number of treatment Options and is a complaint that carries  with it a high risk of complications and Morbidity.  The differential includes COVID, flu, diverticulitis, UTI, pyelonephritis, PID  I ordered, reviewed and interpreted labs, which included CBC with mildly elevated white count normal hemoglobin, chemistries fairly normal, LFTs normal, pregnancy negative, lactate not elevated, COVID and flu negative, urinalysis without signs of infection I ordered medication IV fluids and Tylenol with improvement in her symptoms and tachycardia I ordered imaging studies which included chest x-ray, CT abdomen and pelvis and I independently    visualized and interpreted imaging which showed no acute findings  Previous records obtained and reviewed in epic no recent visits  After the interventions stated above, I reevaluated the patient and found patient symptoms to be moderately improved with medication.  Her tachycardia is mostly resolved.  She has been tolerating p.o.  Currently no indications for admission.  Return instructions discussed   Final Clinical Impression(s) / ED Diagnoses Final diagnoses:  Fever in adult  Myalgia    Rx / DC Orders ED Discharge Orders     None        Terrilee Files, MD 05/05/21 1041

## 2021-05-04 NOTE — ED Provider Notes (Signed)
Emergency Medicine Provider Triage Evaluation Note  Lindsey Hines , a 30 y.o. female  was evaluated in triage.  Pt complains of multiple complaints. Fever, chills, HA, nausea, LLQ abd pain. No diarrhea, dysuria, No cough, SOB. Sx began yesterday. No known COVID exposures   Review of Systems  Positive: HA, fever, myalgias, abd pain Negative: Emesis, CP, SOB, diarrhea, dysuria  Physical Exam  There were no vitals taken for this visit. Gen:   Awake, no distress   Resp:  Normal effort  ABD:  Diffuse tenderness to Left abd MSK:   Moves extremities without difficulty  Other:    Medical Decision Making  Medically screening exam initiated at 4:30 PM.  Appropriate orders placed.  Lindsey Hines was informed that the remainder of the evaluation will be completed by another provider, this initial triage assessment does not replace that evaluation, and the importance of remaining in the ED until their evaluation is complete.  Fever, abd pain, myalgias, HA   Shloimy Michalski A, PA-C 05/04/21 1631    Terald Sleeper, MD 05/04/21 [phone]

## 2021-05-04 NOTE — Discharge Instructions (Signed)
You are seen in the emergency department for evaluation of fever headache body aches nausea abdominal pain.  You had lab work done chest x-ray urinalysis COVID and flu testing that did not show any significant abnormalities.  You had a CAT scan of your abdomen and pelvis that also did not show any obvious explanation for your symptoms.  Please drink plenty of fluids and use Tylenol and ibuprofen as needed for fever and pain.  Follow-up with your doctor.  Return to the emergency department if any worsening or concerning symptoms

## 2021-05-04 NOTE — ED Notes (Signed)
Pt drank water without issue prior to voiding.

## 2021-05-04 NOTE — ED Notes (Signed)
DC instructions reviewed with pt. PT verbalized understanding. PT DC °

## 2021-05-09 LAB — CULTURE, BLOOD (ROUTINE X 2): Culture: NO GROWTH

## 2024-06-15 ENCOUNTER — Other Ambulatory Visit (HOSPITAL_COMMUNITY)
Admission: RE | Admit: 2024-06-15 | Discharge: 2024-06-15 | Disposition: A | Discharge status: Home / Self Care | Source: Ambulatory Visit | Attending: Obstetrics and Gynecology | Admitting: Obstetrics and Gynecology

## 2024-06-15 ENCOUNTER — Encounter: Payer: Self-pay | Admitting: Obstetrics and Gynecology

## 2024-06-15 ENCOUNTER — Ambulatory Visit (INDEPENDENT_AMBULATORY_CARE_PROVIDER_SITE_OTHER): Admitting: Obstetrics and Gynecology

## 2024-06-15 VITALS — BP 106/74 | HR 81 | Ht 65.0 in | Wt 189.0 lb

## 2024-06-15 DIAGNOSIS — Z113 Encounter for screening for infections with a predominantly sexual mode of transmission: Secondary | ICD-10-CM | POA: Diagnosis present

## 2024-06-15 DIAGNOSIS — Z124 Encounter for screening for malignant neoplasm of cervix: Secondary | ICD-10-CM | POA: Diagnosis present

## 2024-06-15 DIAGNOSIS — Z1331 Encounter for screening for depression: Secondary | ICD-10-CM | POA: Diagnosis not present

## 2024-06-15 DIAGNOSIS — Z30432 Encounter for removal of intrauterine contraceptive device: Secondary | ICD-10-CM

## 2024-06-15 DIAGNOSIS — Z01419 Encounter for gynecological examination (general) (routine) without abnormal findings: Secondary | ICD-10-CM

## 2024-06-15 NOTE — Progress Notes (Signed)
 33 y.o. New GYN presents for AEX/PAP/STD screening and IUD removal.  Pt wants to have a baby.

## 2024-06-15 NOTE — Progress Notes (Signed)
 ANNUAL GYNECOLOGY VISIT Chief Complaint  Patient presents with   New Patient (Initial Visit)    GYN     Subjective:  Lindsey Hines is a 33 y.o. [phone] who presents for annual & IUD removal  In person Arabic interpreter Bedor  Doing well today, requests IUD removal. Would like to try to get pregnant. Not yet taking prenatal vitamin  Gyn History: Patient's last menstrual period was 06/07/2024 (exact date). Sexually active: yes/no: Yes Contraception: IUD- copper x 4 years History of STIs: No Last pap: No results found for: DIAGPAP, HPV, HPVHIGH History of abnormal pap: No Periods: regular with IUD   The pregnancy intention screening data noted above was reviewed. Potential methods of contraception were discussed. The patient elected to proceed with No data recorded.       06/15/2024    8:27 AM  Depression screen PHQ 2/9  Decreased Interest 0  Down, Depressed, Hopeless 0  PHQ - 2 Score 0  Altered sleeping 0  Tired, decreased energy 0  Change in appetite 0  Feeling bad or failure about yourself  0  Trouble concentrating 0  Moving slowly or fidgety/restless 0  Suicidal thoughts 0  PHQ-9 Score 0  Difficult doing work/chores Not difficult at all        06/15/2024    8:27 AM  GAD 7 : Generalized Anxiety Score  Nervous, Anxious, on Edge 0  Control/stop worrying 0  Worry too much - different things 0  Trouble relaxing 0  Restless 0  Easily annoyed or irritable 0  Afraid - awful might happen 0  Total GAD 7 Score 0  Anxiety Difficulty Not difficult at all      OB History     Gravida  3   Para  2   Term  2   Preterm      AB  1   Living  2      SAB  1   IAB      Ectopic      Multiple  0   Live Births  2           Past Medical History:  Diagnosis Date   Medical history non-contributory    Sebaceous cyst     Past Surgical History:  Procedure Laterality Date   CESAREAN SECTION N/A 12/10/2017   Procedure: CESAREAN  SECTION;  Surgeon: Edsel Norleen GAILS, MD;  Location: [facility] BIRTHING SUITES;  Service: Obstetrics;  Laterality: N/A;   CESAREAN SECTION N/A 12/25/2020   Procedure: CESAREAN SECTION;  Surgeon: Henry Slough, MD;  Location: [facility] LD [facility]S;  Service: Obstetrics;  Laterality: N/A;   NO PAST SURGERIES      Social History   Socioeconomic History   Marital status: Married    Spouse name: Not on file   Number of children: 2   Years of education: Not on file   Highest education level: Not on file  Occupational History   Not on file  Tobacco Use   Smoking status: Never   Smokeless tobacco: Never  Vaping Use   Vaping status: Never Used  Substance and Sexual Activity   Alcohol use: No   Drug use: No   Sexual activity: Yes  Other Topics Concern   Not on file  Social History Narrative   Not on file   Social Drivers of Health   Financial Resource Strain: Not on file  Food Insecurity: Not on file  Transportation Needs: Not on file  Physical Activity: Not  on file  Stress: Not on file  Social Connections: Not on file    History reviewed. No pertinent family history.  Current Outpatient Medications on File Prior to Visit  Medication Sig Dispense Refill   acetaminophen  (TYLENOL ) 500 MG tablet Take 2 tablets (1,000 mg total) by mouth every 6 (six) hours. (Patient not taking: Reported on 06/15/2024) 30 tablet 0   cetirizine  (ZYRTEC ) 10 MG tablet Take 1 tablet (10 mg total) by mouth daily. (Patient not taking: Reported on 06/15/2024) 30 tablet 0   guaiFENesin  (MUCINEX ) 600 MG 12 hr tablet Take 1 tablet (600 mg total) by mouth 2 (two) times daily as needed. (Patient not taking: Reported on 06/15/2024) 30 tablet 0   ibuprofen  (ADVIL ) 800 MG tablet Take 1 tablet (800 mg total) by mouth every 6 (six) hours. (Patient not taking: Reported on 06/15/2024) 30 tablet 0   pantoprazole (PROTONIX) 40 MG tablet Take 1 tablet by mouth daily. (Patient not taking: Reported on 06/15/2024)     Prenatal Vit-Fe Fumarate-FA  (WESTAB PLUS) 27-1 MG TABS Take 1 tablet by mouth daily. (Patient not taking: Reported on 06/15/2024)     simethicone  (MYLICON) 80 MG chewable tablet Chew 80 mg by mouth every 6 (six) hours as needed for flatulence. (Patient not taking: Reported on 06/15/2024)     No current facility-administered medications on file prior to visit.    No Known Allergies   Objective:   Vitals:   06/15/24 0818  BP: 106/74  Pulse: 81  Weight: 189 lb (85.7 kg)  Height: 5' 5 (1.651 m)   Physical Examination:   General appearance - well appearing, and in no distress  Mental status - alert, oriented to person, place, and time  Psych:  normal mood and affect  Skin - warm and dry, normal color, no suspicious lesions noted  Breasts - breasts appear normal, no suspicious masses, no skin or nipple changes or  axillary nodes  Abdomen - soft, nontender, nondistended, no masses or organomegaly  Pelvic -  VULVA: normal appearing vulva with no masses, tenderness or lesions   VAGINA: normal appearing vagina with normal color and discharge, no lesions   CERVIX: normal appearing cervix without discharge or lesions, no CMT, IUD strings seen  Thin prep pap is done with HR HPV cotesting  UTERUS: uterus is felt to be normal size, shape, consistency and nontender   ADNEXA: No adnexal masses or tenderness noted.  Extremities:  No swelling or varicosities noted  Chaperone present for exam    IUD Removal  Patient identified, informed consent performed, consent signed. Reviewed risk of pain, bleeding, incomplete IUD removal or failed IUD removal. Timeout performed. Patient was in the dorsal lithotomy position, normal external genitalia was noted.  A speculum was placed in the patient's vagina, normal discharge was noted, no lesions. The cervix was visualized, no lesions, no abnormal discharge.  The strings of the IUD were grasped and pulled. The IUD was removed in its entirety.   Patient tolerated the procedure well.     Patient plans for pregnancy soon.       Assessment and Plan:  1. Well woman exam with routine gynecological exam (Primary) Pap/HPV Normal clinical breast exam, reviewed self breast exams, mammograms to start at age 65 STI screening Desires pregnancy  2. Cervical cancer screening - Cytology - PAP( [place])  3. Routine screening for STI (sexually transmitted infection) - Cervicovaginal ancillary only( [place]) - HIV antibody (with reflex) - Hepatitis C Antibody - Hepatitis  B Surface AntiGEN - [facility]  4. Encounter for IUD removal Uncomplicated removal. Advised prenatal with folic acid. Otherwise pt healthy without medical problems and not taking medications.  Rollo ONEIDA Bring, MD, FACOG Obstetrician & Gynecologist, [facility] for [facility], [facility]

## 2024-06-16 ENCOUNTER — Ambulatory Visit: Payer: Self-pay | Admitting: Obstetrics and Gynecology

## 2024-06-16 LAB — CERVICOVAGINAL ANCILLARY ONLY
Chlamydia: NEGATIVE
Comment: NEGATIVE
Comment: NEGATIVE
Comment: NORMAL
Neisseria Gonorrhea: NEGATIVE
Trichomonas: NEGATIVE

## 2024-06-16 LAB — HIV ANTIBODY (ROUTINE TESTING W REFLEX): HIV Screen 4th Generation wRfx: NONREACTIVE

## 2024-06-16 LAB — HEPATITIS C ANTIBODY: Hep C Virus Ab: NONREACTIVE

## 2024-06-16 LAB — RPR: RPR Ser Ql: NONREACTIVE

## 2024-06-16 LAB — HEPATITIS B SURFACE ANTIGEN: Hepatitis B Surface Ag: NEGATIVE

## 2024-06-20 LAB — CYTOLOGY - PAP
Adequacy: ABSENT
Comment: NEGATIVE
Diagnosis: NEGATIVE
High risk HPV: NEGATIVE
